# Patient Record
Sex: Female | Born: 1940 | Race: White | Hispanic: No | Marital: Married | Smoking: Never smoker
Health system: Southern US, Community
[De-identification: ages and names within clinical notes are randomized; demographics above are authoritative.]

## PROBLEM LIST (undated history)

## (undated) DIAGNOSIS — M170 Bilateral primary osteoarthritis of knee: Secondary | ICD-10-CM

## (undated) DIAGNOSIS — K219 Gastro-esophageal reflux disease without esophagitis: Secondary | ICD-10-CM

## (undated) DIAGNOSIS — E119 Type 2 diabetes mellitus without complications: Secondary | ICD-10-CM

## (undated) DIAGNOSIS — I10 Essential (primary) hypertension: Secondary | ICD-10-CM

## (undated) DIAGNOSIS — K5792 Diverticulitis of intestine, part unspecified, without perforation or abscess without bleeding: Secondary | ICD-10-CM

## (undated) DIAGNOSIS — R609 Edema, unspecified: Secondary | ICD-10-CM

## (undated) DIAGNOSIS — E785 Hyperlipidemia, unspecified: Secondary | ICD-10-CM

## (undated) HISTORY — PX: ESOPHAGEAL DILATION: SHX303

## (undated) HISTORY — DX: Type 2 diabetes mellitus without complications: E11.9

## (undated) HISTORY — DX: Essential (primary) hypertension: I10

## (undated) HISTORY — PX: OVARIAN CYST REMOVAL: SHX89

## (undated) HISTORY — DX: Hyperlipidemia, unspecified: E78.5

## (undated) HISTORY — DX: Gastro-esophageal reflux disease without esophagitis: K21.9

## (undated) HISTORY — DX: Diverticulitis of intestine, part unspecified, without perforation or abscess without bleeding: K57.92

## (undated) HISTORY — DX: Edema, unspecified: R60.9

---

## 1999-11-14 ENCOUNTER — Encounter: Payer: Self-pay | Admitting: Family Medicine

## 1999-11-14 ENCOUNTER — Encounter: Admission: RE | Admit: 1999-11-14 | Discharge: 1999-11-14 | Payer: Self-pay | Admitting: *Deleted

## 1999-12-04 ENCOUNTER — Encounter: Payer: Self-pay | Admitting: Family Medicine

## 1999-12-04 ENCOUNTER — Encounter: Admission: RE | Admit: 1999-12-04 | Discharge: 1999-12-04 | Payer: Self-pay | Admitting: Family Medicine

## 2000-11-17 ENCOUNTER — Encounter: Admission: RE | Admit: 2000-11-17 | Discharge: 2000-11-17 | Payer: Self-pay | Admitting: Family Medicine

## 2000-11-17 ENCOUNTER — Encounter: Payer: Self-pay | Admitting: Family Medicine

## 2001-11-18 ENCOUNTER — Encounter: Payer: Self-pay | Admitting: Family Medicine

## 2001-11-18 ENCOUNTER — Encounter: Admission: RE | Admit: 2001-11-18 | Discharge: 2001-11-18 | Payer: Self-pay | Admitting: Family Medicine

## 2002-11-23 ENCOUNTER — Encounter: Admission: RE | Admit: 2002-11-23 | Discharge: 2002-11-23 | Payer: Self-pay | Admitting: Family Medicine

## 2002-11-23 ENCOUNTER — Encounter: Payer: Self-pay | Admitting: Family Medicine

## 2003-08-05 ENCOUNTER — Ambulatory Visit (HOSPITAL_COMMUNITY): Admission: RE | Admit: 2003-08-05 | Discharge: 2003-08-05 | Payer: Self-pay | Admitting: Family Medicine

## 2003-09-05 ENCOUNTER — Encounter (HOSPITAL_COMMUNITY): Admission: RE | Admit: 2003-09-05 | Discharge: 2003-10-05 | Payer: Self-pay | Admitting: Orthopedic Surgery

## 2003-11-24 ENCOUNTER — Encounter: Admission: RE | Admit: 2003-11-24 | Discharge: 2003-11-24 | Payer: Self-pay | Admitting: Family Medicine

## 2004-04-19 ENCOUNTER — Inpatient Hospital Stay (HOSPITAL_COMMUNITY): Admission: AD | Admit: 2004-04-19 | Discharge: 2004-04-22 | Payer: Self-pay | Admitting: Family Medicine

## 2004-06-06 ENCOUNTER — Ambulatory Visit: Payer: Self-pay | Admitting: Internal Medicine

## 2004-07-03 ENCOUNTER — Ambulatory Visit (HOSPITAL_COMMUNITY): Admission: RE | Admit: 2004-07-03 | Discharge: 2004-07-03 | Payer: Self-pay | Admitting: Internal Medicine

## 2004-07-03 ENCOUNTER — Ambulatory Visit: Payer: Self-pay | Admitting: Internal Medicine

## 2004-11-30 ENCOUNTER — Encounter: Admission: RE | Admit: 2004-11-30 | Discharge: 2004-11-30 | Payer: Self-pay | Admitting: Family Medicine

## 2005-12-03 ENCOUNTER — Encounter: Admission: RE | Admit: 2005-12-03 | Discharge: 2005-12-03 | Payer: Self-pay | Admitting: Family Medicine

## 2006-06-11 ENCOUNTER — Ambulatory Visit (HOSPITAL_COMMUNITY): Admission: RE | Admit: 2006-06-11 | Discharge: 2006-06-11 | Payer: Self-pay | Admitting: Family Medicine

## 2006-08-21 ENCOUNTER — Encounter (HOSPITAL_COMMUNITY): Admission: RE | Admit: 2006-08-21 | Discharge: 2006-09-20 | Payer: Self-pay | Admitting: Family Medicine

## 2006-12-08 ENCOUNTER — Encounter: Admission: RE | Admit: 2006-12-08 | Discharge: 2006-12-08 | Payer: Self-pay | Admitting: Family Medicine

## 2007-08-22 ENCOUNTER — Ambulatory Visit: Payer: Self-pay | Admitting: Internal Medicine

## 2007-08-22 ENCOUNTER — Ambulatory Visit (HOSPITAL_COMMUNITY): Admission: EM | Admit: 2007-08-22 | Discharge: 2007-08-22 | Payer: Self-pay | Admitting: Emergency Medicine

## 2007-10-09 ENCOUNTER — Ambulatory Visit (HOSPITAL_COMMUNITY): Admission: RE | Admit: 2007-10-09 | Discharge: 2007-10-09 | Payer: Self-pay | Admitting: Internal Medicine

## 2007-10-09 ENCOUNTER — Ambulatory Visit: Payer: Self-pay | Admitting: Internal Medicine

## 2007-10-09 ENCOUNTER — Encounter: Payer: Self-pay | Admitting: Internal Medicine

## 2007-12-09 ENCOUNTER — Encounter: Admission: RE | Admit: 2007-12-09 | Discharge: 2007-12-09 | Payer: Self-pay | Admitting: Family Medicine

## 2008-12-09 ENCOUNTER — Encounter: Admission: RE | Admit: 2008-12-09 | Discharge: 2008-12-09 | Payer: Self-pay | Admitting: Family Medicine

## 2009-12-07 ENCOUNTER — Encounter: Admission: RE | Admit: 2009-12-07 | Discharge: 2009-12-07 | Payer: Self-pay | Admitting: Family Medicine

## 2010-10-30 NOTE — Op Note (Signed)
Tracy Mckee, Tracy Mckee             ACCOUNT NO.:  [id number]   MEDICAL RECORD NO.:  [id number]          PATIENT TYPE:  AMB   LOCATION:  DAY                           FACILITY:  [facility]   PHYSICIAN:  R. Roetta Sessions, M.D. DATE OF BIRTH:  [birth year]   DATE OF PROCEDURE:  10/09/2007  DATE OF DISCHARGE:                               OPERATIVE REPORT   PROCEDURE PERFORMED:  Esophagogastroduodenoscopy with Elease Hashimoto dilation  and biopsies.   INDICATIONS FOR PROCEDURE:  This 70 year old lady with esophageal  dysphagia presented to emergency room on September 11, 2007, with a food  impaction.  I emergently disimpacted her.  She was found to have a  fibrous ring.  Exam was not complete secondary to the stomach not being  empty.  She has done well, chewing her food very carefully and taking  her time to eat.  Since that time, esophageal dilation as appropriate is  being done.  This approach was discussed with the patient at length.  Potential risks, benefits, and have been reviewed.  Questions answered.  Please see documentation in the medical record.   PROCEDURE NOTE:  O2 saturation, blood pressure, pulse and respirations  were monitored throughout the entire procedure.   CONSCIOUS SEDATION:  Versed 5 mg IV and Demerol 100 g IV in divided  doses.   INSTRUMENT:  Pentax video chip system.   FINDINGS:  Examination of the tubular esophagus revealed a 3-cm tongue  of salmon-colored epithelium coming up from the distal aspect of the  esophagus circumferentially, and in the distal esophagus there was  salmon-colored epithelium coming up a couple of centimeters above the GE  junction with prominent neovascularization pattern.  I did not see any  evidence of active esophagitis or neoplasia.  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  The tubular esophagus was  mildly patent.  EG junction easily traversed.  Stomach:  Gastric cavity  was empty and insufflated well.  A thorough examination of the gastric  mucosa including retroflex at the proximal stomach,  esophagogastric  junction demonstrated a hiatal hernia.  The remainder of gastric mucosa  appeared normal.  Pylorus was patent and easily traversed.  Examination  of the bulb and second portion revealed no abnormalities.   THERAPY/ DIAGNOSTIC MANEUVERS PERFORMED:  A 58-French Maloney dilator  was passed  to full insertion with ease.  A look back revealed the ring  had been ruptured and there was a superficial tear into the UES,  otherwise, there was no apparent complication related to passage of the  large bore dilator; there was minimal bleeding.  Subsequent biopsies of  the tongue and the circumferential area of salmon-colored epithelium  coming up the distal esophagus were taken for histologic study.  Multiple biopsies were taken.  The patient tolerated the procedure well  and was reactive in Endoscopy.   .IMPRESSION:  1. 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  2. Noncritical Schatzki's ring, status post dilation described above.      Hiatal hernia, otherwise normal stomach, patent pylorus, normal      D1, D2.   RECOMMENDATIONS:  1. Omeprazole 20 mg orally daily.  2. Follow up on path.  3. Further recommendations to follow.      Jonathon Bellows, M.D.  Electronically Signed     RMR/MEDQ  D:  10/09/2007  T:  10/10/2007  Job:  [id number]   cc:   Donna Bernard, M.D.  Fax: [phone]

## 2010-10-30 NOTE — Op Note (Signed)
Tracy Mckee, Tracy Mckee             ACCOUNT NO.:  [id number]   MEDICAL RECORD NO.:  [id number]          PATIENT TYPE:  EMS   LOCATION:  ED                            FACILITY:  [facility]   PHYSICIAN:  R. Roetta Sessions, M.D. DATE OF BIRTH:  September 07, 1940   DATE OF PROCEDURE:  08/22/2007  DATE OF DISCHARGE:                               OPERATIVE REPORT   PROCEDURE:  Emergency EGD with food disimpaction. surely a hot biopsy.   INDICATIONS FOR PROCEDURE:  A 70 year old lady with chronic esophageal  dysphagia to solids.  She has very poor dentition and has been getting  food stuck in her esophagus for years.  She reports last evening she  swallowed a good hunk of hot dog and felt it lodge.  She has been unable  to clear it previously (typically, she is able to stick her finger down  her throat and vomit).  She presents to see Dr. Colon Branch today in the ED.  Dr. Colon Branch called me.  Emergency EGD is now being done for disimpaction.  This approach has been discussed with the patient at length.  Potential  risks, benefits, alternatives, and limitations have been reviewed.  She  understands we will not be able to dilate any lesions today, and she  would have come back on another occasional when her upper GI tract is  empty.   PROCEDURE NOTE:  O2 saturation, blood pressure, pulse, and respirations  were monitored throughout the entire procedure.   CONSCIOUS SEDATION:  1. Versed 2 mg IV, Demerol 50 mg IV in a single dose.  2. Cetacaine spray topical pharyngeal anesthesia.   INSTRUMENT:  Pentax video chip system.   FINDINGS:  The esophagus was easily intubated.  There was a large piece  of meat ball valving the distal esophagus.  Nestling the proximal tip of  scope up against this bolus and trying to nudge it down, it would not  go.  Subsequently, I obtained the Lucina Mellow net and went around a good bit of  the proximal aspect of this food bolus and engaged it was able to pull  out a good piece without much  difficulty.  I reintroduced scope in the  esophagus.  There was still a large piece remaining.  I was able to  engage it with the Lucina Mellow net once again and pulled out, again without too  much difficulty.  I reintroduced the scope into the esophagus a third  time and saw a thick, fibrous Schatzki's ring.  The esophagus was  macerated and inflamed.  There was a superficial tear midesophagus and  the hiatal hernia. I made no attempt to complete the exam, and no  attempts dilation were performed.  The patient tolerated the relatively  brief procedure very well and was reactivated.   ENDOSCOPIC IMPRESSION:  Large meat bolus impacted in the esophagus,  status post piecemeal removal, as described above.  Macerated esophageal  mucosa, likely from trauma food impaction over the past 24 hours.  Thick, fibrous distal ring, and a hiatal hernia.  Exam otherwise  incomplete.   RECOMMENDATIONS:  1. Avoid meat, bread, and  other foodstuffs that would predispose one      to recurrent food impaction.  Would stay on a soft diet and chew      food as adequately as possible.  2. Ms. Panjwani needs to see the dentist about getting some teeth.  3. Zegerid 40 mg suspension 1 dose daily for the next 3 weeks.  In 3      weeks, we will bring this nice lady back in for an elective EGD and      esophageal dilation as appropriate.      Jonathon Bellows, M.D.  Electronically Signed     RMR/MEDQ  D:  08/22/2007  T:  08/24/2007  Job:  [id number]   cc:   Donna Bernard, M.D.  Fax: [phone]   Nicoletta Dress. Colon Branch, M.D.  Fax: [phone]

## 2010-11-02 NOTE — Consult Note (Signed)
NAMEMELINA, MOSTELLER             ACCOUNT NO.:  [id number]   MEDICAL RECORD NO.:  [id number]          PATIENT TYPE:  AMB   LOCATION:  DAY                           FACILITY:  [facility]   PHYSICIAN:  Lionel December, M.D.    DATE OF BIRTH:  1940/10/25   DATE OF CONSULTATION:  DATE OF DISCHARGE:  04/22/2004                                   CONSULTATION   REFERRING PHYSICIAN:  Scott A. Gerda Diss, M.D.   REASON FOR CONSULTATION:  Diverticulitis followup.   HISTORY OF PRESENT ILLNESS:  Ms. Inzunza is 70 year old Caucasian female who  was admitted to [facility] with an acute bout of sigmoid  diverticulitis on April 19, 2004.  She had been having bilateral lower  quadrant cramping abdominal pain as well as fever.  She was treated with IV  Levaquin and Flagyl while hospitalized and then discharged home on  clindamycin 300 mg q.i.d. for 10 days.  She has since completed her course  of antibiotic and is doing quite well.  She does report occasional  intermittent fleeting cramping to her lower abdomen, denies any fever or  chills, nausea, vomiting, diarrhea, or constipation as this time.  Bowel  movements have been soft and brown without any rectal bleeding or melena or  mucus in her stools.  Weight has remained stable, appetite is good.  She has  never had a complete colonoscopy.  She did have a flexible sigmoidoscopy in  1999, by Dr. Karilyn Cota which was normal.   PAST MEDICAL HISTORY:  1.  Diabetes mellitus which is diet controlled at this time.  2.  Hypertension diagnosed about 1-1/2 years ago.  3.  Flexible sigmoidoscopy in 1999, by Dr. Karilyn Cota, which was normal.  4.  Right laparotomy for right ovarian cystectomy.   CURRENT MEDICATIONS:  Unknown antihypertensives, the patient will call with  this medication.   ALLERGIES:  No known drug allergies.   FAMILY HISTORY:  No known family history of colorectal carcinoma, liver or  chronic GI problems.  Mother deceased at age 101 with breast  carcinoma.  Father deceased secondary to accident.  She has one brother alive and  healthy.  One sister deceased.   SOCIAL HISTORY:  Ms. Hausler has been married for 37 years.  She has three  grown healthy children.  She is employed full-time with [employer] and  also works part-time at [employer].  She reports a remote seven year tobacco use  history.  Reports consuming approximately one alcoholic beverage per day,  typically a beer or a glass of wine.  Denies any drug use.   REVIEW OF SYSTEMS:  CONSTITUTIONAL:  Denies any fatigue, denies anorexia or  early satiety.  Weight is stable.  CARDIOVASCULAR:  Denies any chest pain,  or history of heart murmur, valve problems.  PULMONARY:  Denies any  shortness of breath, dyspnea, cough, or hemoptysis.  GASTROINTESTINAL:  See  HPI.  Denies any heartburn, indigestion, dysphagia, or odynophagia.   PHYSICAL EXAMINATION:  VITAL SIGNS:  Weight 221.5 pounds, temperature 97.8,  blood pressure 150/84, pulse 68.  GENERAL:  Ms. Kole is a  70 year old obese Caucasian female who is alert,  oriented, pleasant, and cooperative, in no acute distress.  HEENT:  Sclerae clear, nonicteric.  Conjunctivae pink.  Oropharynx moist  without any lesions.  NECK:  Supple without mass or thyromegaly.  CHEST:  Heart is regular rate and rhythm.  Normal S1 and S2, without  murmurs, rubs, or gallops.  LUNGS:  Clear to auscultation bilaterally.  ABDOMEN:  Protuberant, obese, with positive bowel sounds x4.  No bruits  auscultated.  Soft, nontender, nondistended.  She does have a low midline  scar which is well-healed.  Abdomen is nontender without palpable mass or  hepatosplenomegaly.  No rebound tenderness or guarding.  Although  examination is limited due to patient's body habitus.  RECTAL:  Deferred.  EXTREMITIES:  Trace edema bilateral lower extremities with 2+ pedal pulses  bilaterally.  SKIN:  Pink, warm, and dry, without any rash or jaundice.   IMPRESSION:  Ms.  Swalley is a 70 year old Caucasian female with a bout of  acute sigmoid diverticulitis, approximately six weeks ago she has responded  well to antibiotic therapy and it appears her diverticulitis has resolved.  Given the fact that she has never had complete colonoscopy, we should  proceed with this at this time to make sure that there is no underlying  colorectal carcinoma.  She is in agreement with this plan.   RECOMMENDATIONS:  1.  We will schedule colonoscopy with Dr. Karilyn Cota in the next two weeks or      so.  If she develops any further pain or new signs or symptoms, she is      to give our office a call.  2.  I have discussed colonoscopy including risks and benefits to include,      but not limited to bleeding, perforation, drug reaction.  Informed      consent will be obtained.  3.  Further recommendations pending procedure with standard diverticulosis      instructions given.   We would like to thank Dr. Simone Curia for allowing [place] to participate in  the care of Ms. Kressin.     Kand   KC/MEDQ  D:  06/06/2004  T:  06/06/2004  Job:  [id number]   cc:   Donna Bernard, M.D.  [address]  [place]  [place] [address]  Fax: [phone]

## 2010-11-02 NOTE — Op Note (Signed)
NAMEDAURICE, OVANDO             ACCOUNT NO.:  [id number]   MEDICAL RECORD NO.:  [id number]          PATIENT TYPE:  AMB   LOCATION:  DAY                           FACILITY:  [facility]   PHYSICIAN:  Lionel December, M.D.    DATE OF BIRTH:  [birth year]   DATE OF PROCEDURE:  07/03/2004  DATE OF DISCHARGE:                                 OPERATIVE REPORT   PROCEDURE:  Total colonoscopy.   INDICATIONS:  Tracy Mckee is a 70 year old Caucasian female who was hospitalized  in November 2005 due to diverticulitis. She was advised colonoscopy once she  recovers from acute illness. She is doing well other than transient episodes  of lower abdominal pain. She is supposed to be on high-fiber diet and  Citrucel.  She states she does not the Citrucel daily. She is undergoing  colonoscopy both for diagnostic and screening purposes. Procedures risks  were reviewed with the patient, informed consent was obtained.   PREMEDICATION:  Demerol 50 milligrams IV, Versed 6 milligrams IV.   FINDINGS:  Procedure performed in endoscopy suite. The patient's vital signs  and O2 sat were monitored during procedure and remained stable. The patient  was placed left lateral recumbent position and rectal examination performed.  No abnormality noted external or digital exam. Olympus videoscope was placed  in the rectum and advanced under vision into sigmoid colon which was  somewhat noncompliant with multiple diverticula. Two diverticula at sigmoid  colon showed changes of diverticulitis, one had erythematous mucosa with  mucopurulent exudate.  The other one had peristomal erythema. Multiple  diverticula noted at descending colon and one had ulceration with  mucopurulent discharge. Scattered diverticula were also noted of transverse  colon and few at the ascending colon. Scope was passed to the cecum which  was identified by appendiceal orifice and ileocecal valve. Pictures taken  for the record. As the scope was withdrawn,  colonic mucosa was carefully  examined and there were no polyps and or tumor masses. Rectal mucosa was  normal. Scope was retroflexed to examine anorectal junction which was  unremarkable. Endoscope was straightened and withdrawn. The patient  tolerated the procedure well.   FINAL DIAGNOSIS:  1.  Pan colonic diverticulosis.  2.  Diverticulitis involving three diverticula.  Two were at sigmoid and one      at descending colon.   RECOMMENDATIONS:  Cipro 500 milligrams p.o. b.i.d. for [redacted] weeks along with  metronidazole 500 milligrams p.o. b.i.d. for 2 weeks. She needs to be on  high fiber diet and Citrucel one tablespoonful daily.   She should continue yearly Hemoccults and consider next screening in 10  years from now.      NR/MEDQ  D:  07/03/2004  T:  07/03/2004  Job:  [id number]   cc:   Lorin Picket A. Gerda Diss, MD  [address]  [place]  [place] [address]  Fax: [phone]

## 2010-11-02 NOTE — Discharge Summary (Signed)
NAMESEINI, LANNOM             ACCOUNT NO.:  [id number]   MEDICAL RECORD NO.:  [id number]          PATIENT TYPE:  INP   LOCATION:  A335                          FACILITY:  [facility]   PHYSICIAN:  Donna Bernard, M.D.DATE OF BIRTH:  [birth year]   DATE OF ADMISSION:  04/19/2004  DATE OF DISCHARGE:  11/06/2005LH                                 DISCHARGE SUMMARY   FINAL DIAGNOSES:  1.  Diverticulitis.  2.  Hypertension.  3.  Type 2 diabetes.   FINAL DISPOSITION:  The patient is discharged to home.   DISCHARGE MEDICATIONS:  Clindamycin 300 mg q.i.d. x10 days.   FOLLOWUP:  Follow up in the office in five days.   HISTORY OF PRESENT ILLNESS:  Please see H&P as dictated.   HOSPITAL COURSE:  This patient is a 70 year old white female who presented  to the office on the day of admission with complaints of abdominal pain that  was quite severe in nature.  She was admitted to the hospital where a CBC  showed a white blood cell count of 12,000.  CT scan revealed sigmoid  diverticulitis.  The patient was placed on bowel rest and IV antibiotics.  She was given Flagyl 500 mg IV q.6h., and Levaquin 500 mg IV q.24h.  Over  the next several days, the patient gradually improved, her glucose's were  monitored and these remained within decent limits.  On the day of discharge,  the patient was feeling better, abdominal examination was improved.  She was  discharged home with diagnoses and disposition as noted above.     Kristine Royal   WSL/MEDQ  D:  05/02/2004  T:  05/02/2004  Job:  [id number]

## 2010-11-02 NOTE — H&P (Signed)
Tracy Mckee, DEJOY             ACCOUNT NO.:  [id number]   MEDICAL RECORD NO.:  [id number]          PATIENT TYPE:  INP   LOCATION:  A335                          FACILITY:  [facility]   PHYSICIAN:  Donna Bernard, M.D.DATE OF BIRTH:  08-Feb-1941   DATE OF ADMISSION:  04/19/2004  DATE OF DISCHARGE:  LH                                HISTORY & PHYSICAL   CHIEF COMPLAINT:  Abdominal pain.   HISTORY OF PRESENT ILLNESS:  This patient is a 70 year old white female with  a history of hypertension and type 2 diabetes, controlled primarily by diet,  who presented to the office on the day of admission with complaints of low  abdominal pain.  The patient has a longstanding history of occasional  transient left lower-quadrant pain which usually resolves within 24 hours.  Sometimes this is accompanied by loose stools.  However, this time the pain  came on and stayed.  The patient started to develop fever, chills and fever,  along was nausea.  She had no active vomiting.  She did have a couple of  loose stools.  She also noted some urinary frequency with no true dysuria.  The patient noted fever at home.  The patient also notes diminished appetite  this morning.  She had a bit of a difficult night with abdominal discomfort  intermittently.   PRIOR SURGERIES:  Remote cyst removal in the right ovary in 1979.   FAMILY HISTORY:  Positive for breast cancer, hypertension, heart disease.   ALLERGIES:  None known.   SOCIAL HISTORY:  The patient is married with three children.  No tobacco  use.  Occasional beer drinking three to four beers per week.  The patient  claims compliance with her medications which include Dyazide 37.5/25, one  daily.  Protonix p.r.n. and various over-the-counter medications.   REVIEW OF SYSTEMS:  Otherwise negative.   PHYSICAL EXAMINATION:  VITAL SIGNS:  Blood pressure 146/90, temperature  100.2.  GENERAL:  The patient is in apparent distress, holding abdomen.  HEENT:   Normal.  Oropharynx __________.  NECK:  Supple.  LUNGS:  Clear.  HEART:  Regular rate and rhythm.  ABDOMEN:  Low abdominal and left lower-quadrant tenderness.  PELVIC EXAM:  Adnexal tenderness on the left side.  No obvious masses.  RECTAL:  Unremarkable.  Negative.  EXTREMITIES:  Normal.  SKIN:  Normal.   LABORATORY DATA:  CBC:  White blood count 12,000 with a left shift.  MET-7  within normal limits except for glucose of 139, sodium 131.  CT scan of the  abdomen and pelvis, sigmoid diverticulosis and diverticulitis.  No abscess  or free air.   IMPRESSION:  1.  Diverticulitis.  2.  Hypertension.  3.  Type 2 diabetes, diet controlled.   PLAN:  IV antibiotics, bowel rest, IV fluids, further orders as on the  chart.      WSL/MEDQ  D:  04/20/2004  T:  04/20/2004  Job:  [id number]

## 2010-11-22 ENCOUNTER — Other Ambulatory Visit: Payer: Self-pay | Admitting: Family Medicine

## 2010-11-22 DIAGNOSIS — Z1231 Encounter for screening mammogram for malignant neoplasm of breast: Secondary | ICD-10-CM

## 2010-12-10 ENCOUNTER — Ambulatory Visit
Admission: RE | Admit: 2010-12-10 | Discharge: 2010-12-10 | Disposition: A | Discharge status: Home / Self Care | Payer: Private Health Insurance - Indemnity | Source: Ambulatory Visit | Attending: Family Medicine | Admitting: Family Medicine

## 2010-12-10 DIAGNOSIS — Z1231 Encounter for screening mammogram for malignant neoplasm of breast: Secondary | ICD-10-CM

## 2011-01-24 ENCOUNTER — Other Ambulatory Visit: Payer: Self-pay | Admitting: Family Medicine

## 2011-01-24 ENCOUNTER — Other Ambulatory Visit (HOSPITAL_COMMUNITY)
Admission: RE | Admit: 2011-01-24 | Discharge: 2011-01-24 | Disposition: A | Discharge status: Home / Self Care | Payer: Private Health Insurance - Indemnity | Source: Ambulatory Visit | Attending: Family Medicine | Admitting: Family Medicine

## 2011-01-24 DIAGNOSIS — Z124 Encounter for screening for malignant neoplasm of cervix: Secondary | ICD-10-CM | POA: Insufficient documentation

## 2011-01-24 DIAGNOSIS — Z1159 Encounter for screening for other viral diseases: Secondary | ICD-10-CM | POA: Insufficient documentation

## 2011-11-22 ENCOUNTER — Other Ambulatory Visit: Payer: Self-pay | Admitting: Family Medicine

## 2011-11-22 DIAGNOSIS — Z1231 Encounter for screening mammogram for malignant neoplasm of breast: Secondary | ICD-10-CM

## 2011-12-10 ENCOUNTER — Ambulatory Visit
Admission: RE | Admit: 2011-12-10 | Discharge: 2011-12-10 | Disposition: A | Discharge status: Home / Self Care | Payer: Medicare Other | Source: Ambulatory Visit | Attending: Family Medicine | Admitting: Family Medicine

## 2011-12-10 DIAGNOSIS — Z1231 Encounter for screening mammogram for malignant neoplasm of breast: Secondary | ICD-10-CM

## 2011-12-13 ENCOUNTER — Other Ambulatory Visit: Payer: Self-pay | Admitting: Family Medicine

## 2011-12-13 DIAGNOSIS — R928 Other abnormal and inconclusive findings on diagnostic imaging of breast: Secondary | ICD-10-CM

## 2011-12-25 ENCOUNTER — Ambulatory Visit
Admission: RE | Admit: 2011-12-25 | Discharge: 2011-12-25 | Disposition: A | Discharge status: Home / Self Care | Payer: Medicare Other | Source: Ambulatory Visit | Attending: Family Medicine | Admitting: Family Medicine

## 2011-12-25 DIAGNOSIS — R928 Other abnormal and inconclusive findings on diagnostic imaging of breast: Secondary | ICD-10-CM

## 2012-11-25 ENCOUNTER — Other Ambulatory Visit: Payer: Self-pay

## 2012-11-25 DIAGNOSIS — Z1231 Encounter for screening mammogram for malignant neoplasm of breast: Secondary | ICD-10-CM

## 2013-01-05 ENCOUNTER — Ambulatory Visit
Admission: RE | Admit: 2013-01-05 | Discharge: 2013-01-05 | Disposition: A | Discharge status: Home / Self Care | Payer: Medicare Other | Source: Ambulatory Visit

## 2013-01-05 DIAGNOSIS — Z1231 Encounter for screening mammogram for malignant neoplasm of breast: Secondary | ICD-10-CM

## 2015-11-16 ENCOUNTER — Encounter (HOSPITAL_COMMUNITY): Payer: Self-pay | Admitting: Emergency Medicine

## 2015-11-16 ENCOUNTER — Emergency Department (HOSPITAL_COMMUNITY)
Admission: EM | Admit: 2015-11-16 | Discharge: 2015-11-16 | Disposition: A | Discharge status: Home / Self Care | Payer: PPO | Attending: Emergency Medicine | Admitting: Emergency Medicine

## 2015-11-16 ENCOUNTER — Emergency Department (HOSPITAL_COMMUNITY): Payer: PPO

## 2015-11-16 DIAGNOSIS — Z79899 Other long term (current) drug therapy: Secondary | ICD-10-CM | POA: Diagnosis not present

## 2015-11-16 DIAGNOSIS — I10 Essential (primary) hypertension: Secondary | ICD-10-CM | POA: Diagnosis not present

## 2015-11-16 DIAGNOSIS — E785 Hyperlipidemia, unspecified: Secondary | ICD-10-CM | POA: Diagnosis not present

## 2015-11-16 DIAGNOSIS — G8929 Other chronic pain: Secondary | ICD-10-CM | POA: Diagnosis not present

## 2015-11-16 DIAGNOSIS — E119 Type 2 diabetes mellitus without complications: Secondary | ICD-10-CM | POA: Insufficient documentation

## 2015-11-16 DIAGNOSIS — Z7984 Long term (current) use of oral hypoglycemic drugs: Secondary | ICD-10-CM | POA: Diagnosis not present

## 2015-11-16 DIAGNOSIS — M25561 Pain in right knee: Secondary | ICD-10-CM | POA: Diagnosis present

## 2015-11-16 MED ORDER — HYDROCODONE-ACETAMINOPHEN 5-325 MG PO TABS
1.0000 | ORAL_TABLET | Freq: Once | ORAL | Status: AC
  Administered 2015-11-16: 1 via ORAL
  Filled 2015-11-16: qty 1

## 2015-11-16 MED ORDER — HYDROCODONE-ACETAMINOPHEN 5-325 MG PO TABS: ORAL_TABLET | ORAL | Status: DC

## 2015-11-16 NOTE — Discharge Instructions (Signed)
Take the prescription as directed.  Apply moist heat or ice to the area(s) of discomfort, for 15 minutes at a time, several times per day for the next few days.  Do not fall asleep on a heating or ice pack. Wear the knee immobilizer and use the crutches until you are seen in follow up. Call the Orthopedic doctor tomorrow to schedule a follow up appointment within the next week.  Return to the Emergency Department immediately if worsening.
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
## 2015-11-16 NOTE — ED Notes (Signed)
PT states she was putting boxes in her car last night and twisted her right knee. PT c/o pain unrelieved by Aleve medication. PT ambulatory in triage with knee brace in place a this time.

## 2015-11-16 NOTE — ED Notes (Signed)
MD at bedside. 

## 2015-11-22 ENCOUNTER — Encounter: Payer: Self-pay | Admitting: Orthopaedic Surgery

## 2015-11-22 ENCOUNTER — Ambulatory Visit (INDEPENDENT_AMBULATORY_CARE_PROVIDER_SITE_OTHER): Payer: PPO | Admitting: Orthopaedic Surgery

## 2015-11-22 VITALS — BP 144/74 | HR 83 | Temp 97.5°F | Ht 62.25 in | Wt 190.0 lb

## 2015-11-22 DIAGNOSIS — M25561 Pain in right knee: Secondary | ICD-10-CM

## 2015-11-22 MED ORDER — TRAMADOL HCL 50 MG PO TABS: 50.0000 mg | ORAL_TABLET | Freq: Four times a day (QID) | ORAL | Status: AC | PRN
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
## 2015-11-22 NOTE — Patient Instructions (Addendum)
Patient to get her records sent to us from her Ortho Dr. In [place].  Begin PT.  Take pain medicine as directed.

## 2015-12-05 ENCOUNTER — Ambulatory Visit (INDEPENDENT_AMBULATORY_CARE_PROVIDER_SITE_OTHER): Payer: PPO | Admitting: Orthopaedic Surgery

## 2015-12-05 ENCOUNTER — Encounter: Payer: Self-pay | Admitting: Orthopaedic Surgery

## 2015-12-05 ENCOUNTER — Encounter (HOSPITAL_COMMUNITY): Payer: Self-pay | Admitting: Physical Therapy

## 2015-12-05 ENCOUNTER — Ambulatory Visit (HOSPITAL_COMMUNITY): Payer: PPO | Attending: Orthopaedic Surgery | Admitting: Physical Therapy

## 2015-12-05 VITALS — BP 153/71 | HR 77 | Temp 97.9°F | Ht 63.0 in | Wt 189.0 lb

## 2015-12-05 DIAGNOSIS — S83206D Unspecified tear of unspecified meniscus, current injury, right knee, subsequent encounter: Secondary | ICD-10-CM | POA: Diagnosis not present

## 2015-12-05 DIAGNOSIS — M6281 Muscle weakness (generalized): Secondary | ICD-10-CM | POA: Insufficient documentation

## 2015-12-05 DIAGNOSIS — M179 Osteoarthritis of knee, unspecified: Secondary | ICD-10-CM

## 2015-12-05 DIAGNOSIS — R2681 Unsteadiness on feet: Secondary | ICD-10-CM | POA: Diagnosis present

## 2015-12-05 DIAGNOSIS — R2689 Other abnormalities of gait and mobility: Secondary | ICD-10-CM | POA: Insufficient documentation

## 2015-12-05 DIAGNOSIS — M25561 Pain in right knee: Secondary | ICD-10-CM | POA: Diagnosis present

## 2015-12-05 DIAGNOSIS — M1711 Unilateral primary osteoarthritis, right knee: Secondary | ICD-10-CM

## 2015-12-05 DIAGNOSIS — M25661 Stiffness of right knee, not elsewhere classified: Secondary | ICD-10-CM

## 2015-12-05 NOTE — Patient Instructions (Signed)
Last MRI was done in [place] in Oct. 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 Precautions discussed.  Continue therapy. Form for a parking handicap sticker was filled out.  Out of work.

## 2015-12-05 NOTE — Therapy (Signed)
[facility] [address] [place], [place], [address] Phone: [phone]   Fax:  [phone]  Physical Therapy Evaluation  Patient Details  Name: Tracy Mckee MRN: [id number] Date of Birth: 1941/03/12 Referring Provider: Darreld Mclean, MD  Encounter Date: 12/05/2015      PT End of Session - 12/05/15 2110    Visit Number 1   Number of Visits 6   Date for PT Re-Evaluation 12/26/15   Authorization Type [employer]   Authorization Time Period 12/05/15 to 01/19/16   PT Start Time 1518   PT Stop Time 1600   PT Time Calculation (min) 42 min   Activity Tolerance Patient tolerated treatment well   Behavior During Therapy [facility] for tasks assessed/performed      Past Medical History  Diagnosis Date  . Diverticulitis   . Diabetes mellitus without complication (HCC)   . Hyperlipidemia   . Hypertension   . Edema   . GERD (gastroesophageal reflux disease)   . Knee pain     Past Surgical History  Procedure Laterality Date  . Ovarian cyst removal    . Esophageal dilation      There were no vitals filed for this visit.       Subjective Assessment - 12/05/15 1518    Subjective Pt reports recently relocating from [place]. She went to a specialist in [place] back in November 2016 after pain that came out of nowhere. They did imaging and found a ligament that was "slightly" torn. She saw a PT there and received ultrasound which she feels helped the most. 2 weeks ago she twisted her knee and went to Dr. Hilda Lias who sent her to PT to see what improvement she can get from her session the next week.   Currently in Pain? Yes   Pain Score --  aching   Pain Location Knee   Pain Orientation Right;Medial   Pain Descriptors / Indicators Aching   Pain Type Acute pain   Pain Radiating Towards none    Pain Onset 1 to 4 weeks ago   Pain Frequency Intermittent   Aggravating Factors  standing/walking    Pain Relieving Factors resting   Effect  of Pain on Daily Activities unable to do ADLs.             [facility] PT Assessment - 12/06/15 0001    Assessment   Medical Diagnosis Rt knee pain   Referring Provider Darreld Mclean, MD   Onset Date/Surgical Date 11/21/15   Next MD Visit 12/12/15   Precautions   Precautions None   Precaution Comments pt wearing knee immobilizer   Restrictions   Weight Bearing Restrictions No   Balance Screen   Has the patient fallen in the past 6 months No   Has the patient had a decrease in activity level because of a fear of falling?  No   Is the patient reluctant to leave their home because of a fear of falling?  No   Observation/Other Assessments   Focus on Therapeutic Outcomes (FOTO)  63% limited    Functional Tests   Functional tests Sit to Stand   Sit to Stand   Comments increased weight shift to the Lt    Strength   Right Hip Extension 4-/5   Right Hip ABduction 4/5   Left Hip Flexion 5/5   Left Hip Extension 4-/5   Left Hip ABduction 4/5   Right Knee Flexion 4/5   Right Knee Extension 4/5  Left Knee Flexion 5/5   Left Knee Extension 5/5   Right Ankle Dorsiflexion 5/5   Left Ankle Dorsiflexion 5/5   Palpation   Palpation comment Rt knee medial jt line tenderness; distal medial Rt hamstring/hip adductors tender to palpation   Special Tests    Special Tests Knee Special Tests;Meniscus Tests   Meniscus Tests McMurray Test;Apley's Compression;other;other2   McMurray Test   Findings Positive   Side Right   Comments medial jt line pain   Apley's Compression   Findings Negative   Side  Right   other   Findings Positive   Side Right   Comments Valgus stress test for MCL   Transfers   Five time sit to stand comments  14.48 sec   Ambulation/Gait   Ambulation/Gait Yes   Ambulation Distance (Feet) 200 Feet   Gait Comments initially without AD noting evident antalgic gait pattern; noting impovement in posture/pattern with use of SPC; overall: decreased stance time Rt, decreased step  length Lt, knee flexion throughout cycle   Balance   Balance Assessed Yes  SLS BLE up to 20 sec                           PT Education - 12/05/15 2106    Education provided Yes   Education Details eval findings/POC; anatomy of knee and purpose of meniscus; discussed importance of good hip/knee strength to support knee joint regardless of whether she undergoes repair or not; initiated HEP   Person(s) Educated Patient   Methods Explanation;Demonstration;Handout   Comprehension Verbalized understanding          PT Short Term Goals - 12/06/15 0804    PT SHORT TERM GOAL #1   Title Pt will demo consistency and independence with HEP   Time 2   Period Weeks   Status New   PT SHORT TERM GOAL #2   Title Pt will demo improve knee extension ROM to atleast 0 deg in order to improve safety with ambulation   Time 3   Period Weeks   Status New   PT SHORT TERM GOAL #3   Title Pt will demo improved gait pattern and use of SPC x150' with improved step length and stance time on RLE   Time 3   Period Weeks   Status New   PT SHORT TERM GOAL #4   Title Pt will demo correct sit to stand technique without significant weight shift Lt throughout her session with no cues from PT   Time 3   Period Weeks   Status New           PT Long Term Goals - 12/06/15 0806    PT LONG TERM GOAL #1   Title Pt will demo improved BLE strength to 5/5 in order to improve safety with functional mobility.   Time 6   Period Weeks   Status New   PT LONG TERM GOAL #2   Title Pt will demo improved functional strength evident by her ability to perform 5x sit to stand in less than 13 sec without UE support.    Time 6   Period Weeks   Status New               Plan - 12/05/15 2114    Clinical Impression Statement Pt was referred to OPPT concerning Rt knee pain with meniscal tear of several months. Pain was initially alleviated after several weeks of conservative management. 2  weeks ago  she twisted her knee and since then she has had intense pain along the medial aspect of her Rt knee along with catching and occasional buckling. She has not had any new imaging, however past imaging showed medial meniscus tear and McMurray's test is positive along medial jt. Knee valgus testing was also positive, and she is tender to palpation along the medial thigh, so I am unable to rule out possible MCL involvement as well. Pain has gotten worse since the onset and she has been using an immobilizer for ~2 weeks. She demonstrates limitations in Rt knee ROM, LE strength, and impaired mobility with noted antalgic gait pattern walking into the clinic. I educated the pt on the anatomy of the knee as well as the role of the meniscus. I also provided HEP and encouraged her to perform daily to see if any change occurs. Overall, her PT prognosis is fair due to the structural damage and worsening nature of her knee even after immobilization. She would benefit from skilled PT to address muscle spasm/guarding noted throughout her hamstrings/adductors as well as improve her knee ROM and BLE strength. This will increase the stability at the knee and decrease risk of falls/further injury. Pt is in agreement with proposed PT frequency and POC.    Rehab Potential Fair   Clinical Impairments Affecting Rehab Potential structural damage to mensicus and worsening pain since initial onset    PT Frequency 1x / week   PT Duration 6 weeks   PT Treatment/Interventions ADLs/Self Care Home Management;Cryotherapy;Gait training;Stair training;Functional mobility training;Therapeutic activities;Patient/family education;Neuromuscular re-education;Balance training;Therapeutic exercise;Orthotic Fit/Training;Manual techniques;Passive range of motion;Taping   PT Next Visit Plan f/u concerning MD apt; hip strengthening (bridges, hip abduction), manual to hamstrings/adductors to address muscle spasming/decrease pain    PT Home Exercise Plan  initiated with quad set, bridge, clamshell, hip adductor stretch   Recommended Other Services None    Consulted and Agree with Plan of Care Patient      Patient will benefit from skilled therapeutic intervention in order to improve the following deficits and impairments:  Abnormal gait, Decreased activity tolerance, Decreased balance, Decreased endurance, Decreased mobility, Decreased range of motion, Impaired flexibility, Pain, Improper body mechanics, Difficulty walking, Increased muscle spasms  Visit Diagnosis: Pain in right knee  Stiffness of right knee, not elsewhere classified  Unsteadiness on feet  Other abnormalities of gait and mobility  Muscle weakness (generalized)      G-Codes - 12-16-2015 10-27-2107    Functional Assessment Tool Used FOTO; atleast 60% limited   Functional Limitation Mobility: Walking and moving around   Mobility: Walking and Moving Around Current Status [phone]) At least 40 percent but less than 60 percent impaired, limited or restricted   Mobility: Walking and Moving Around Goal Status [id number]) At least 40 percent but less than 60 percent impaired, limited or restricted       Problem List There are no active problems to display for this patient.   8:09 AM,12/06/2015 Marylyn Ishihara PT, DPT [facility] [phone]  [facility] [address] [place], [place], [address] Phone: [phone]   Fax:  [phone]  Name: Tracy Mckee MRN: [id number] Date of Birth: March 19, 1941

## 2015-12-05 NOTE — Progress Notes (Signed)
Patient WJ:XBJYNWG:Tracy Mckee, female DOB:1940/11/18, 75 y.o. NFA:[id number]  Chief Complaint  Patient presents with  . Follow-up    Right knee    HPI  Tracy Mckee is a 75 y.o. female who has continued right knee pain.  She had notes sent from [facility] in [place].  I just received them.  She has a medial meniscus tear of the right knee documented by MRI in October, 2016 in [place].  She went to PT and got better until recently.  I have explained what this is and she has not been to PT yet here.  She has an appointment today later.  I have told her she may need arthroscopy as she is not improving. She has swelling, popping, giving way and deep pain at times.  She has no new injury.  HPI  Body mass index is 33.49 kg/(m^2).  ROS  Review of Systems  HENT: Negative for congestion.   Respiratory: Negative for cough and shortness of breath.   Cardiovascular: Negative for chest pain and leg swelling.  Endocrine: Positive for cold intolerance.  Musculoskeletal: Positive for joint swelling, arthralgias and gait problem.  Allergic/Immunologic: Positive for environmental allergies.  Neurological: Negative for numbness.    Past Medical History  Diagnosis Date  . Diverticulitis   . Diabetes mellitus without complication (HCC)   . Hyperlipidemia   . Hypertension   . Edema   . GERD (gastroesophageal reflux disease)   . Knee pain     Past Surgical History  Procedure Laterality Date  . Ovarian cyst removal    . Esophageal dilation      History reviewed. No pertinent family history.  Social History Social History  Substance Use Topics  . Smoking status: Never Smoker   . Smokeless tobacco: None  . Alcohol Use: Yes     Comment: occassionally    Allergies  Allergen Reactions  . Baclofen   . Ciprofloxacin   . Hydrocodone-Acetaminophen Other (See Comments)    Patient states it made her feel like she was having a heart attack  . Other Swelling    Chest tightness-2  unknown antibiotics (One starts with a "B" and the other, a "C")    Current Outpatient Prescriptions  Medication Sig Dispense Refill  . fluticasone (FLONASE) 50 MCG/ACT nasal spray Place into both nostrils daily.    Marland Kitchen. glimepiride (AMARYL) 2 MG tablet Take 2 mg by mouth daily with breakfast.    . metFORMIN (GLUCOPHAGE-XR) 500 MG 24 hr tablet Take 500 mg by mouth 2 (two) times daily.    Marland Kitchen. omeprazole (PRILOSEC) 40 MG capsule Take 40 mg by mouth daily.    . traMADol (ULTRAM) 50 MG tablet Take 1 tablet (50 mg total) by mouth every 6 (six) hours as needed. 60 tablet 3  . triamterene-hydrochlorothiazide (MAXZIDE-25) 37.5-25 MG per tablet Take 1 tablet by mouth daily.     No current facility-administered medications for this visit.     Physical Exam  Blood pressure 153/71, pulse 77, temperature 97.9 F (36.6 C), height 5\' 3"  (1.6 m), weight 189 lb (85.73 kg).  Constitutional: overall normal hygiene, normal nutrition, well developed, normal grooming, normal body habitus. Assistive device:Knee immobilizer  Musculoskeletal: gait and station Limp right, muscle tone and strength are normal, no tremors or atrophy is present.  .  Neurological: coordination overall normal.  Deep tendon reflex/nerve stretch intact.  Sensation normal.  Cranial nerves II-XII intact.   Skin:   normal overall no scars, lesions, ulcers or rashes. No  psoriasis.  Psychiatric: Alert and oriented x 3.  Recent memory intact, remote memory unclear.  Normal mood and affect. Well groomed.  Good eye contact.  Cardiovascular: overall no swelling, no varicosities, no edema bilaterally, normal temperatures of the legs and arms, no clubbing, cyanosis and good capillary refill.  Lymphatic: palpation is normal.  The right lower extremity is examined:  Inspection:  Thigh:  Non-tender and no defects  Knee has swelling 2+ effusion.                        Joint tenderness is present                        Patient is tender over the  medial joint line  Lower Leg:  Has normal appearance and no tenderness or defects  Ankle:  Non-tender and no defects  Foot:  Non-tender and no defects Range of Motion:  Knee:  Range of motion is: 0-100                        Crepitus is  present  Ankle:  Range of motion is normal. Strength and Tone:  The right lower extremity has normal strength and tone. Stability:  Knee:  The knee has positive medial McMurray.  Ankle:  The ankle is stable.    The patient has been educated about the nature of the problem(s) and counseled on treatment options.  The patient appeared to understand what I have discussed and is in agreement with it.  Encounter Diagnoses  Name Primary?  . Meniscus tear, right, subsequent encounter Yes  . Osteoarthritis of right knee, unspecified osteoarthritis type     PLAN Call if any problems.  Precautions discussed.  Continue current medications.   Return to clinic 1 week   Go to PT  Consider arthroscopy.  Electronically Signed Darreld Mclean, MD 6/20/20172:20 PM

## 2015-12-12 ENCOUNTER — Ambulatory Visit (INDEPENDENT_AMBULATORY_CARE_PROVIDER_SITE_OTHER): Payer: PPO | Admitting: Orthopaedic Surgery

## 2015-12-12 ENCOUNTER — Telehealth (HOSPITAL_COMMUNITY): Payer: Self-pay | Admitting: Physical Therapy

## 2015-12-12 ENCOUNTER — Encounter: Payer: Self-pay | Admitting: Orthopaedic Surgery

## 2015-12-12 VITALS — BP 145/79 | HR 76 | Ht 63.0 in | Wt 189.0 lb

## 2015-12-12 DIAGNOSIS — S83206D Unspecified tear of unspecified meniscus, current injury, right knee, subsequent encounter: Secondary | ICD-10-CM | POA: Diagnosis not present

## 2015-12-12 DIAGNOSIS — M25561 Pain in right knee: Secondary | ICD-10-CM | POA: Diagnosis not present

## 2015-12-12 DIAGNOSIS — M179 Osteoarthritis of knee, unspecified: Secondary | ICD-10-CM

## 2015-12-12 DIAGNOSIS — M1711 Unilateral primary osteoarthritis, right knee: Secondary | ICD-10-CM

## 2015-12-12 NOTE — Telephone Encounter (Signed)
Pt saw Md today and states that she will have surgery soon on her knee, please d/c from PT

## 2015-12-12 NOTE — Progress Notes (Signed)
Patient ZO:XWRUEAV:Tracy Mckee, female DOB:04-11-1941, 75 y.o. WUJ:[id number]  Chief Complaint  Patient presents with  . Follow-up    1 week follow up right knee    HPI  Tracy Mckee is a [age] y.o. female who has known tear of the medial meniscus from MRI in [place] in October of 2016.  She has been to PT and is worse.  She has giving way and pain of the right knee.  She needs arthroscopy.  I had discussed this with her last time.  She wanted to try PT again.  I will have her see Dr. Romeo AppleHarrison for consideration of arthroscopy.  I will give Rx for walker.  HPI  Body mass index is 33.49 kg/(m^2).  ROS  Review of Systems  HENT: Negative for congestion.   Respiratory: Negative for cough and shortness of breath.   Cardiovascular: Negative for chest pain and leg swelling.  Endocrine: Positive for cold intolerance.  Musculoskeletal: Positive for joint swelling, arthralgias and gait problem.  Allergic/Immunologic: Positive for environmental allergies.  Neurological: Negative for numbness.    Past Medical History  Diagnosis Date  . Diverticulitis   . Diabetes mellitus without complication (HCC)   . Hyperlipidemia   . Hypertension   . Edema   . GERD (gastroesophageal reflux disease)   . Knee pain     Past Surgical History  Procedure Laterality Date  . Ovarian cyst removal    . Esophageal dilation      History reviewed. No pertinent family history.  Social History Social History  Substance Use Topics  . Smoking status: Never Smoker   . Smokeless tobacco: None  . Alcohol Use: Yes     Comment: occassionally    Allergies  Allergen Reactions  . Baclofen   . Ciprofloxacin   . Hydrocodone-Acetaminophen Other (See Comments)    Patient states it made her feel like she was having a heart attack  . Other Swelling    Chest tightness-2 unknown antibiotics (One starts with a "B" and the other, a "C")    Current Outpatient Prescriptions  Medication Sig Dispense Refill  .  fluticasone (FLONASE) 50 MCG/ACT nasal spray Place into both nostrils daily.    Marland Kitchen. glimepiride (AMARYL) 2 MG tablet Take 2 mg by mouth daily with breakfast.    . metFORMIN (GLUCOPHAGE-XR) 500 MG 24 hr tablet Take 500 mg by mouth 2 (two) times daily.    Marland Kitchen. omeprazole (PRILOSEC) 40 MG capsule Take 40 mg by mouth daily.    . traMADol (ULTRAM) 50 MG tablet Take 1 tablet (50 mg total) by mouth every 6 (six) hours as needed. 60 tablet 3  . triamterene-hydrochlorothiazide (MAXZIDE-25) 37.5-25 MG per tablet Take 1 tablet by mouth daily.     No current facility-administered medications for this visit.     Physical Exam  Blood pressure 145/79, pulse 76, height 5\' 3"  (1.6 m), weight 189 lb (85.73 kg).  Constitutional: overall normal hygiene, normal nutrition, well developed, normal grooming, normal body habitus. Assistive device:cane  Musculoskeletal: gait and station Limp right, muscle tone and strength are normal, no tremors or atrophy is present.  .  Neurological: coordination overall normal.  Deep tendon reflex/nerve stretch intact.  Sensation normal.  Cranial nerves II-XII intact.   Skin:   normal overall no scars, lesions, ulcers or rashes. No psoriasis.  Psychiatric: Alert and oriented x 3.  Recent memory intact, remote memory unclear.  Normal mood and affect. Well groomed.  Good eye contact.  Cardiovascular: overall no  swelling, no varicosities, no edema bilaterally, normal temperatures of the legs and arms, no clubbing, cyanosis and good capillary refill.  Lymphatic: palpation is normal.  The right lower extremity is examined:  Inspection:  Thigh:  Non-tender and no defects  Knee has swelling 2+ effusion.                        Joint tenderness is present                        Patient is tender over the medial joint line  Lower Leg:  Has normal appearance and no tenderness or defects  Ankle:  Non-tender and no defects  Foot:  Non-tender and no defects Range of Motion:  Knee:   Range of motion is: 0-100                        Crepitus is  present  Ankle:  Range of motion is normal. Strength and Tone:  The right lower extremity has normal strength and tone. Stability:  Knee:  The knee has positive medial McMurray.  Ankle:  The ankle is stable.    The patient has been educated about the nature of the problem(s) and counseled on treatment options.  The patient appeared to understand what I have discussed and is in agreement with it.  Encounter Diagnoses  Name Primary?  . Meniscus tear, right, subsequent encounter Yes  . Osteoarthritis of right knee, unspecified osteoarthritis type   . Right knee pain     PLAN Call if any problems.  Precautions discussed.  Continue current medications.   Return to clinic See Dr. Romeo AppleHarrison for consideration of arthroscopy of the right knee.   Electronically Signed Darreld McleanWayne Izreal Kock, MD 6/27/20172:06 PM

## 2015-12-14 ENCOUNTER — Encounter (HOSPITAL_COMMUNITY): Payer: PPO | Admitting: Physical Therapy

## 2015-12-21 ENCOUNTER — Encounter (HOSPITAL_COMMUNITY): Payer: PPO | Admitting: Physical Therapy

## 2015-12-27 ENCOUNTER — Encounter (HOSPITAL_COMMUNITY): Payer: PPO | Admitting: Physical Therapy

## 2015-12-28 ENCOUNTER — Encounter: Payer: Self-pay | Admitting: Orthopedic Surgery

## 2016-01-02 ENCOUNTER — Ambulatory Visit (INDEPENDENT_AMBULATORY_CARE_PROVIDER_SITE_OTHER): Payer: PPO | Admitting: Orthopedic Surgery

## 2016-01-02 ENCOUNTER — Encounter: Payer: Self-pay | Admitting: Orthopedic Surgery

## 2016-01-02 VITALS — BP 137/76 | Ht 63.0 in | Wt 190.0 lb

## 2016-01-02 DIAGNOSIS — S83206D Unspecified tear of unspecified meniscus, current injury, right knee, subsequent encounter: Secondary | ICD-10-CM

## 2016-01-02 DIAGNOSIS — M1711 Unilateral primary osteoarthritis, right knee: Secondary | ICD-10-CM

## 2016-01-02 DIAGNOSIS — M179 Osteoarthritis of knee, unspecified: Secondary | ICD-10-CM | POA: Diagnosis not present

## 2016-01-02 NOTE — Progress Notes (Signed)
Patient ID: Tracy MorrowShirley A Mckee, female   DOB: [birth year], 75 y.o.   MRN: [id number]  Chief Complaint  Patient presents with  . Knee Pain    right knee pain, referred by Dr Hilda LiasKeeling    HPI   75 year old female presents for evaluation of right knee pain she's had an MRI which shows she has a medial meniscus which coincides with her symptoms of medial knee pain inability to weight-bear loss of motion and mechanical symptoms.  She was treated adequately nonoperatively with standard measures including but not limited to NSAIDs rest cane walker and did not improve  Her knee has come symptomatic enough to prevent her from working and doing normal activities    Review of Systems  Constitutional: Negative for fever and chills.  Eyes: Negative for photophobia.  Respiratory: Negative for shortness of breath.   Cardiovascular: Negative for chest pain.  Gastrointestinal: Negative for heartburn.  Genitourinary: Negative for dysuria.  Musculoskeletal: Positive for joint pain.  Skin: Negative for rash.  Neurological: Negative for dizziness and headaches.  Endo/Heme/Allergies: Negative for environmental allergies and polydipsia. Does not bruise/bleed easily.  Psychiatric/Behavioral: Negative for depression.    Past Medical History  Diagnosis Date  . Diverticulitis   . Diabetes mellitus without complication (HCC)   . Hyperlipidemia   . Hypertension   . Edema   . GERD (gastroesophageal reflux disease)   . Knee pain     BP 137/76 mmHg  Ht 5\' 3"  (1.6 m)  Wt 190 lb (86.183 kg)  BMI 33.67 kg/m2  Physical Exam  Musculoskeletal:       Left knee: She exhibits no effusion.   Physical Exam  Constitutional: The patient appears well-developed and well-nourished. No distress.  The patient is oriented to person, place, and time.  Psychiatric: The patient has a normal mood and affect.  Cardiovascular: Intact distal pulses.   Neurological: sensation is normal  Skin: Skin is warm and dry. No rash  noted. The patient is not diaphoretic. No erythema. No pallor.    Right Knee Exam   Tenderness  The patient is experiencing tenderness in the medial joint line.  Range of Motion  Extension: 10  Flexion: 120   Muscle Strength   The patient has normal right knee strength.  Tests  McMurray:  Medial - positive  Lachman:  Anterior - negative    Posterior - negative Drawer:       Anterior - negative    Posterior - negative Varus: negative Valgus: negative  Other  Erythema: absent Scars: absent Sensation: normal Pulse: present Swelling: none   Left Knee Exam   Tenderness  The patient is experiencing no tenderness.     Range of Motion  The patient has normal left knee ROM.  Muscle Strength   The patient has normal left knee strength.  Tests  McMurray:  Medial - negative  Lachman:  Anterior - negative    Posterior - negative Drawer:       Anterior - negative     Posterior - negative Varus: negative Valgus: negative  Other  Erythema: absent Sensation: normal Pulse: present Swelling: none Effusion: no effusion present      ASSESSMENT AND PLAN   Medial meniscus tear right knee with mild OA  Plan arthroscopy right knee and medial meniscectomy  This procedure has been fully reviewed with the patient and written informed consent has been obtained.   Fuller CanadaStanley Renwick Asman, MD 01/02/2016 10:14 AM

## 2016-01-02 NOTE — Patient Instructions (Signed)
You have decided to proceed with operative arthroscopy of the knee. You have decided not to continue with nonoperative measures such as but not limited to oral medication, weight loss, activity modification, physical therapy, bracing, or injection.  We will perform operative arthroscopy of the knee. Some of the risks associated with arthroscopic surgery of the knee include but are not limited to Bleeding Infection Swelling Stiffness Blood clot Pain  If you're not comfortable with these risks and would like to continue with nonoperative treatment please let Dr. Ivann Trimarco know prior to your surgery. 

## 2016-01-03 ENCOUNTER — Encounter (HOSPITAL_COMMUNITY): Payer: PPO

## 2016-01-05 NOTE — Patient Instructions (Addendum)
Tracy Mckee  01/05/2016     @PREFPERIOPPHARMACY @   Your procedure is scheduled on  01/10/2016   [facility] at  1110  A.M.  Call this number if you have problems the morning of surgery:  [phone]   Remember:  Do not eat food or drink liquids after midnight.  Take these medicines the morning of surgery with A SIP OF WATER  Prilosec, ultram  Do not wear jewelry, make-up or nail polish.  Do not wear lotions, powders, or perfumes.  You may wear deoderant.  Do not shave 48 hours prior to surgery.  Men may shave face and neck.  Do not bring valuables to the hospital.  [facility] is not responsible for any belongings or valuables.  Contacts, dentures or bridgework may not be worn into surgery.  Leave your suitcase in the car.  After surgery it may be brought to your room.  For patients admitted to the hospital, discharge time will be determined by your treatment team.  Patients discharged the day of surgery will not be allowed to drive home.   Name and phone number of your driver:   family Special instructions:  none  Please read over the following fact sheets that you were given. Coughing and Deep Breathing, Surgical Site Infection Prevention, Anesthesia Post-op Instructions and Care and Recovery After Surgery      Meniscus Injury, Arthroscopy Arthroscopy is a surgical procedure that involves the use of a small scope that has a camera and surgical instruments on the end (arthroscope). An arthroscope can be used to repair your meniscus injury.  LET [facility] KNOW ABOUT:  Any allergies you have.  All medicines you are taking, including vitamins, herbs, eyedrops, creams, and over-the-counter medicines.  Any recent colds or infections you have had or currently have.  Previous problems you or members of your family have had with the use of anesthetics.  Any blood disorders or blood clotting problems you have.  Previous surgeries  you have had.  Medical conditions you have. RISKS AND COMPLICATIONS Generally, this is a safe procedure. However, as with any procedure, problems can occur. Possible problems include:  Damage to nerves or blood vessels.  Excess bleeding.  Blood clots.  Infection. BEFORE THE PROCEDURE  Do not eat or drink for 6-8 hours before the procedure.  Take medicines as directed by your surgeon. Ask your surgeon about changing or stopping your regular medicines.  You may have lab tests the morning of surgery. PROCEDURE  You will be given one of the following:   A medicine that numbs the area (local anesthesia).  A medicine that makes you go to sleep (general anesthesia).  A medicine injected into your spine that numbs your body below the waist (spinal anesthesia). Most often, several small cuts (incisions) are made in the knee. The arthroscope and instruments go into the incisions to repair the damage. The torn portion of the meniscus is removed.  During this time, your surgeon may find a partial or complete tear in a cruciate ligament, such as the anterior cruciate ligament (ACL). A completely torn cruciate ligament is reconstructed by taking tissue from another part of the body (grafting) and placing it into the injured area. This requires several larger incisions to complete the repair. Sometimes, open surgery is needed for collateral ligament injuries. If a collateral ligament is found to be injured, your surgeon may staple or suture the tear through  a slightly larger incision on the side of the knee. AFTER THE PROCEDURE You will be taken to the recovery area where your progress will be monitored. When you are awake, stable, and taking fluids without complications, you will be allowed to go home. This is usually the same day. However, more extensive repairs of a ligament may require an overnight stay.  The recovery time after repairing your meniscus or ligament depends on the amount of damage  to these structures. It also depends on whether or not reconstructive knee surgery was needed.   A torn or stretched ligament (ligament sprain) may take 6-8 weeks to heal. It takes about the same amount of time if your surgeon removed a torn meniscus.  A repaired meniscus may require 6-12 weeks of recovery time.  A torn ligament needing reconstructive surgery may take 6-12 months to heal fully.   This information is not intended to replace advice given to you by your health care provider. Make sure you discuss any questions you have with your health care provider.   Document Released: 05/31/2000 Document Revised: 06/08/2013 Document Reviewed: 10/30/2012 Elsevier Interactive Patient Education 2016 [employer]. Arthroscopy, With Meniscus Injury, Care After Refer to this sheet in the next few weeks. These instructions provide you with general information on caring for yourself after your procedure. Your health care provider may also give you specific instructions. Your treatment has been planned according to the current medical practices, but problems sometimes occur. Call your health care provider if you have any problems or questions after your procedure. WHAT TO EXPECT AFTER THE PROCEDURE After your procedure, it is typical to have the following:  Pain and swelling in your knee.  Constipation.  Difficulty walking. HOME CARE INSTRUCTIONS   Use crutches and do knee exercises as directed by your health care provider.  Apply ice to the injured area:  Put ice in a plastic bag.  Place a towel between your skin and the bag.  Leave the ice on for 15-20 minutes, 3-4 times a day while awake. Do this for the first 2 days.  Rest and raise (elevate) your knee.  Change bandages (dressings) as directed by your health care provider.  Keep the wound dry and clean. The wound may be washed gently with soap and water. Gently blot or dab the wound dry. It is okay to take showers 24-48 hours after  surgery. Do not take baths, use swimming pools, or use hot tubs for 14 days, or as directed by your health care provider.  Only take over-the-counter or prescription medicines for pain, discomfort, or fever as directed by your health care provider.  Continue your normal diet as directed by your health care provider.  Do not lift anything more than 10 pounds or play contact sports for 3 weeks, or as directed by your health care provider.  If a brace was applied, use as directed by your health care provider.  Your health care provider will help with instructions for rehabilitation of your knee. SEEK MEDICAL CARE IF:   You have increased bleeding (more than a small spot) from the wound.  You have redness, swelling, or increasing pain in the wound.  Yellowish-white fluid (pus) is coming from your wound. SEEK IMMEDIATE MEDICAL CARE IF:   You develop a rash.  You have a fever or persistent symptoms for more than 2-3 days.  You have difficulty breathing.  You have increasing pain with movement of the knee. MAKE SURE YOU:   Understand these  instructions.  Will watch your condition.  Will get help right away if you are not doing well or get worse.   This information is not intended to replace advice given to you by your health care provider. Make sure you discuss any questions you have with your health care provider.   Document Released: 12/21/2004 Document Revised: 02/03/2013 Document Reviewed: 11/10/2012 Elsevier Interactive Patient Education 2016 [employer] PATIENT INSTRUCTIONS POST-ANESTHESIA  IMMEDIATELY FOLLOWING SURGERY:  Do not drive or operate machinery for the first twenty four hours after surgery.  Do not make any important decisions for twenty four hours after surgery or while taking narcotic pain medications or sedatives.  If you develop intractable nausea and vomiting or a severe headache please notify your doctor immediately.  FOLLOW-UP:  Please make an  appointment with your surgeon as instructed. You do not need to follow up with anesthesia unless specifically instructed to do so.  WOUND CARE INSTRUCTIONS (if applicable):  Keep a dry clean dressing on the anesthesia/puncture wound site if there is drainage.  Once the wound has quit draining you may leave it open to air.  Generally you should leave the bandage intact for twenty four hours unless there is drainage.  If the epidural site drains for more than 36-48 hours please call the anesthesia department.  QUESTIONS?:  Please feel free to call your physician or the hospital operator if you have any questions, and they will be happy to assist you.

## 2016-01-08 ENCOUNTER — Encounter (HOSPITAL_COMMUNITY): Payer: Self-pay

## 2016-01-08 ENCOUNTER — Other Ambulatory Visit: Payer: Self-pay

## 2016-01-08 ENCOUNTER — Encounter (HOSPITAL_COMMUNITY)
Admission: RE | Admit: 2016-01-08 | Discharge: 2016-01-08 | Disposition: A | Discharge status: Home / Self Care | Payer: PPO | Source: Ambulatory Visit | Attending: Orthopedic Surgery | Admitting: Orthopedic Surgery

## 2016-01-08 DIAGNOSIS — I10 Essential (primary) hypertension: Secondary | ICD-10-CM | POA: Diagnosis not present

## 2016-01-08 DIAGNOSIS — E119 Type 2 diabetes mellitus without complications: Secondary | ICD-10-CM | POA: Diagnosis not present

## 2016-01-08 DIAGNOSIS — Z79899 Other long term (current) drug therapy: Secondary | ICD-10-CM | POA: Diagnosis not present

## 2016-01-08 DIAGNOSIS — Z7984 Long term (current) use of oral hypoglycemic drugs: Secondary | ICD-10-CM | POA: Diagnosis not present

## 2016-01-08 DIAGNOSIS — K219 Gastro-esophageal reflux disease without esophagitis: Secondary | ICD-10-CM | POA: Diagnosis not present

## 2016-01-08 DIAGNOSIS — S83241A Other tear of medial meniscus, current injury, right knee, initial encounter: Secondary | ICD-10-CM | POA: Diagnosis present

## 2016-01-08 DIAGNOSIS — X58XXXA Exposure to other specified factors, initial encounter: Secondary | ICD-10-CM | POA: Diagnosis not present

## 2016-01-08 DIAGNOSIS — M1711 Unilateral primary osteoarthritis, right knee: Secondary | ICD-10-CM | POA: Diagnosis not present

## 2016-01-08 DIAGNOSIS — S83281A Other tear of lateral meniscus, current injury, right knee, initial encounter: Secondary | ICD-10-CM | POA: Diagnosis not present

## 2016-01-08 LAB — BASIC METABOLIC PANEL
ANION GAP: 7 (ref 5–15)
BUN: 20 mg/dL (ref 6–20)
CALCIUM: 9.2 mg/dL (ref 8.9–10.3)
CHLORIDE: 101 mmol/L (ref 101–111)
CO2: 26 mmol/L (ref 22–32)
Creatinine, Ser: 0.88 mg/dL (ref 0.44–1.00)
GFR calc Af Amer: >60 mL/min (ref >60)
GFR calc non Af Amer: >60 mL/min (ref >60)
GLUCOSE: 200 mg/dL — AB (ref 65–99)
POTASSIUM: 4.3 mmol/L (ref 3.5–5.1)
Sodium: 134 mmol/L — ABNORMAL LOW (ref 135–145)

## 2016-01-08 LAB — CBC
HEMATOCRIT: 40.7 % (ref 36.0–46.0)
Hemoglobin: 14 g/dL (ref 12.0–15.0)
MCH: 28.7 pg (ref 26.0–34.0)
MCHC: 34.4 g/dL (ref 30.0–36.0)
MCV: 83.6 fL (ref 78.0–100.0)
Platelets: 171 10*3/uL (ref 150–400)
RBC: 4.87 MIL/uL (ref 3.87–5.11)
RDW: 12.9 % (ref 11.5–15.5)
WBC: 6.1 10*3/uL (ref 4.0–10.5)

## 2016-01-09 NOTE — H&P (Signed)
Chief Complaint  Patient presents with  . Knee Pain      right knee pain, referred by Dr Hilda Lias      HPI   75 year old female presents for evaluation of right knee pain she's had an MRI which shows she has a medial meniscus which coincides with her symptoms of medial knee pain inability to weight-bear loss of motion and mechanical symptoms.   She was treated adequately nonoperatively with standard measures including but not limited to NSAIDs rest cane walker and did not improve   Her knee has come symptomatic enough to prevent her from working and doing normal activities     Review of Systems  Constitutional: Negative for fever and chills.  Eyes: Negative for photophobia.  Respiratory: Negative for shortness of breath.   Cardiovascular: Negative for chest pain.  Gastrointestinal: Negative for heartburn.  Genitourinary: Negative for dysuria.  Musculoskeletal: Positive for joint pain.  Skin: Negative for rash.  Neurological: Negative for dizziness and headaches.  Endo/Heme/Allergies: Negative for environmental allergies and polydipsia. Does not bruise/bleed easily.  Psychiatric/Behavioral: Negative for depression.     Past Surgical History:  Procedure Laterality Date  . ESOPHAGEAL DILATION    . OVARIAN CYST REMOVAL     Family history is negative for serious medical conditions apart kidney or lung  Social History  Substance Use Topics  . Smoking status: Never Smoker  . Smokeless tobacco: Never Used  . Alcohol use Yes     Comment: occassionally           Past Medical History  Diagnosis Date  . Diverticulitis    . Diabetes mellitus without complication (HCC)    . Hyperlipidemia    . Hypertension    . Edema    . GERD (gastroesophageal reflux disease)    . Knee pain        BP 137/76 mmHg  Ht  (1.6 m)  Wt 190 lb (86.183 kg)  BMI 33.67 kg/m2   Physical Exam  Musculoskeletal:       Left knee: She exhibits no effusion.    Physical Exam  Constitutional:  The patient appears well-developed and well-nourished. No distress.  The patient is oriented to person, place, and time.  Psychiatric: The patient has a normal mood and affect.  Cardiovascular: Intact distal pulses.   Neurological: sensation is normal  Skin: Skin is warm and dry. No rash noted. The patient is not diaphoretic. No erythema. No pallor.      Left and right upper extremity no contracture subluxation atrophy tremor skin changes or neurovascular deficits. Right Knee Exam    Tenderness  The patient is experiencing tenderness in the medial joint line.   Range of Motion  Extension: 10  Flexion: 120    Muscle Strength    The patient has normal right knee strength.   Tests  McMurray:  Medial - positive  Lachman:  Anterior - negative    Posterior - negative Drawer:       Anterior - negative    Posterior - negative Varus: negative Valgus: negative   Other  Erythema: absent Scars: absent Sensation: normal Pulse: present Swelling: none     Left Knee Exam    Tenderness  The patient is experiencing no tenderness.        Range of Motion  The patient has normal left knee ROM.   Muscle Strength    The patient has normal left knee strength.   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

## 2016-01-10 ENCOUNTER — Ambulatory Visit (HOSPITAL_COMMUNITY): Payer: PPO | Admitting: Anesthesiology

## 2016-01-10 ENCOUNTER — Ambulatory Visit (HOSPITAL_COMMUNITY)
Admission: RE | Admit: 2016-01-10 | Discharge: 2016-01-10 | Disposition: A | Discharge status: Home / Self Care | Payer: PPO | Source: Ambulatory Visit | Attending: Orthopedic Surgery | Admitting: Orthopedic Surgery

## 2016-01-10 ENCOUNTER — Encounter (HOSPITAL_COMMUNITY)
Admission: RE | Disposition: A | Discharge status: Home / Self Care | Payer: Self-pay | Source: Ambulatory Visit | Attending: Orthopedic Surgery

## 2016-01-10 ENCOUNTER — Telehealth: Payer: Self-pay | Admitting: *Deleted

## 2016-01-10 ENCOUNTER — Encounter (HOSPITAL_COMMUNITY): Payer: PPO

## 2016-01-10 DIAGNOSIS — S83241A Other tear of medial meniscus, current injury, right knee, initial encounter: Secondary | ICD-10-CM | POA: Diagnosis not present

## 2016-01-10 DIAGNOSIS — Z7984 Long term (current) use of oral hypoglycemic drugs: Secondary | ICD-10-CM | POA: Insufficient documentation

## 2016-01-10 DIAGNOSIS — X58XXXA Exposure to other specified factors, initial encounter: Secondary | ICD-10-CM | POA: Insufficient documentation

## 2016-01-10 DIAGNOSIS — S83281A Other tear of lateral meniscus, current injury, right knee, initial encounter: Secondary | ICD-10-CM | POA: Insufficient documentation

## 2016-01-10 DIAGNOSIS — M23300 Other meniscus derangements, unspecified lateral meniscus, right knee: Secondary | ICD-10-CM | POA: Diagnosis not present

## 2016-01-10 DIAGNOSIS — M23321 Other meniscus derangements, posterior horn of medial meniscus, right knee: Secondary | ICD-10-CM

## 2016-01-10 DIAGNOSIS — E119 Type 2 diabetes mellitus without complications: Secondary | ICD-10-CM | POA: Diagnosis not present

## 2016-01-10 DIAGNOSIS — M1711 Unilateral primary osteoarthritis, right knee: Secondary | ICD-10-CM | POA: Diagnosis not present

## 2016-01-10 DIAGNOSIS — M23329 Other meniscus derangements, posterior horn of medial meniscus, unspecified knee: Secondary | ICD-10-CM

## 2016-01-10 DIAGNOSIS — M23306 Other meniscus derangements, unspecified meniscus, right knee: Secondary | ICD-10-CM | POA: Diagnosis not present

## 2016-01-10 DIAGNOSIS — K219 Gastro-esophageal reflux disease without esophagitis: Secondary | ICD-10-CM | POA: Insufficient documentation

## 2016-01-10 DIAGNOSIS — I10 Essential (primary) hypertension: Secondary | ICD-10-CM | POA: Insufficient documentation

## 2016-01-10 DIAGNOSIS — M23303 Other meniscus derangements, unspecified medial meniscus, right knee: Secondary | ICD-10-CM

## 2016-01-10 DIAGNOSIS — M23302 Other meniscus derangements, unspecified lateral meniscus, unspecified knee: Secondary | ICD-10-CM

## 2016-01-10 DIAGNOSIS — Z79899 Other long term (current) drug therapy: Secondary | ICD-10-CM | POA: Insufficient documentation

## 2016-01-10 HISTORY — PX: KNEE ARTHROSCOPY WITH MEDIAL MENISECTOMY: SHX5651

## 2016-01-10 LAB — GLUCOSE, CAPILLARY
GLUCOSE-CAPILLARY: 163 mg/dL — AB (ref 65–99)
Glucose-Capillary: 257 mg/dL — ABNORMAL HIGH (ref 65–99)

## 2016-01-10 SURGERY — ARTHROSCOPY, KNEE, WITH MEDIAL MENISCECTOMY
Anesthesia: General | Site: Knee | Laterality: Right

## 2016-01-10 MED ORDER — FENTANYL CITRATE (PF) 100 MCG/2ML IJ SOLN
INTRAMUSCULAR | Status: AC
  Filled 2016-01-10: qty 2

## 2016-01-10 MED ORDER — EPINEPHRINE HCL 1 MG/ML IJ SOLN
INTRAMUSCULAR | Status: AC
  Filled 2016-01-10: qty 5

## 2016-01-10 MED ORDER — LIDOCAINE HCL 1 % IJ SOLN
INTRAMUSCULAR | Status: DC | PRN
  Administered 2016-01-10: 25 mg via INTRADERMAL

## 2016-01-10 MED ORDER — TRAMADOL HCL 50 MG PO TABS
50.0000 mg | ORAL_TABLET | Freq: Once | ORAL | Status: AC
  Administered 2016-01-10: 50 mg via ORAL

## 2016-01-10 MED ORDER — CEFAZOLIN SODIUM-DEXTROSE 2-4 GM/100ML-% IV SOLN
INTRAVENOUS | Status: AC
  Filled 2016-01-10: qty 100

## 2016-01-10 MED ORDER — FENTANYL CITRATE (PF) 100 MCG/2ML IJ SOLN
25.0000 ug | INTRAMUSCULAR | Status: AC
  Administered 2016-01-10 (×2): 25 ug via INTRAVENOUS

## 2016-01-10 MED ORDER — KETOROLAC TROMETHAMINE 30 MG/ML IJ SOLN
30.0000 mg | Freq: Once | INTRAMUSCULAR | Status: AC
  Administered 2016-01-10: 30 mg via INTRAVENOUS

## 2016-01-10 MED ORDER — MIDAZOLAM HCL 2 MG/2ML IJ SOLN
INTRAMUSCULAR | Status: AC
  Filled 2016-01-10: qty 2

## 2016-01-10 MED ORDER — DEXAMETHASONE SODIUM PHOSPHATE 4 MG/ML IJ SOLN
INTRAMUSCULAR | Status: AC
  Filled 2016-01-10: qty 1

## 2016-01-10 MED ORDER — LABETALOL HCL 5 MG/ML IV SOLN
INTRAVENOUS | Status: AC
  Filled 2016-01-10: qty 4

## 2016-01-10 MED ORDER — ONDANSETRON HCL 4 MG/2ML IJ SOLN
4.0000 mg | Freq: Once | INTRAMUSCULAR | Status: DC | PRN
  Filled 2016-01-10: qty 2

## 2016-01-10 MED ORDER — EPINEPHRINE HCL 1 MG/ML IJ SOLN
INTRAMUSCULAR | Status: AC
  Filled 2016-01-10: qty 2

## 2016-01-10 MED ORDER — PROPOFOL 10 MG/ML IV BOLUS
INTRAVENOUS | Status: AC
  Filled 2016-01-10: qty 20

## 2016-01-10 MED ORDER — FENTANYL CITRATE (PF) 100 MCG/2ML IJ SOLN
INTRAMUSCULAR | Status: DC | PRN
  Administered 2016-01-10 (×2): 50 ug via INTRAVENOUS
  Administered 2016-01-10 (×3): 25 ug via INTRAVENOUS

## 2016-01-10 MED ORDER — FENTANYL CITRATE (PF) 100 MCG/2ML IJ SOLN
25.0000 ug | INTRAMUSCULAR | Status: DC | PRN
  Administered 2016-01-10: 25 ug via INTRAVENOUS
  Administered 2016-01-10: 50 ug via INTRAVENOUS
  Administered 2016-01-10: 25 ug via INTRAVENOUS
  Filled 2016-01-10: qty 2

## 2016-01-10 MED ORDER — PROPOFOL 10 MG/ML IV BOLUS
INTRAVENOUS | Status: DC | PRN
  Administered 2016-01-10: 140 mg via INTRAVENOUS

## 2016-01-10 MED ORDER — ONDANSETRON HCL 4 MG/2ML IJ SOLN
4.0000 mg | Freq: Once | INTRAMUSCULAR | Status: AC
  Administered 2016-01-10: 4 mg via INTRAVENOUS

## 2016-01-10 MED ORDER — BUPIVACAINE-EPINEPHRINE (PF) 0.5% -1:200000 IJ SOLN
INTRAMUSCULAR | Status: DC | PRN
  Administered 2016-01-10: 60 mL

## 2016-01-10 MED ORDER — TRAMADOL HCL 50 MG PO TABS
ORAL_TABLET | ORAL | Status: AC
  Filled 2016-01-10: qty 1

## 2016-01-10 MED ORDER — BUPIVACAINE-EPINEPHRINE (PF) 0.5% -1:200000 IJ SOLN
INTRAMUSCULAR | Status: AC
  Filled 2016-01-10: qty 60

## 2016-01-10 MED ORDER — ONDANSETRON HCL 4 MG/2ML IJ SOLN
INTRAMUSCULAR | Status: AC
  Filled 2016-01-10: qty 2

## 2016-01-10 MED ORDER — LIDOCAINE HCL (PF) 1 % IJ SOLN
INTRAMUSCULAR | Status: AC
  Filled 2016-01-10: qty 5

## 2016-01-10 MED ORDER — LACTATED RINGERS IV SOLN
INTRAVENOUS | Status: DC
  Administered 2016-01-10: 1000 mL via INTRAVENOUS

## 2016-01-10 MED ORDER — SODIUM CHLORIDE 0.9 % IR SOLN
Status: DC | PRN
  Administered 2016-01-10 (×6): 3000 mL

## 2016-01-10 MED ORDER — LABETALOL HCL 5 MG/ML IV SOLN
10.0000 mg | INTRAVENOUS | Status: DC | PRN
  Administered 2016-01-10: 10 mg via INTRAVENOUS

## 2016-01-10 MED ORDER — KETOROLAC TROMETHAMINE 30 MG/ML IJ SOLN
INTRAMUSCULAR | Status: AC
  Filled 2016-01-10: qty 1

## 2016-01-10 MED ORDER — MIDAZOLAM HCL 5 MG/5ML IJ SOLN
INTRAMUSCULAR | Status: DC | PRN
  Administered 2016-01-10: 2 mg via INTRAVENOUS

## 2016-01-10 MED ORDER — DEXAMETHASONE SODIUM PHOSPHATE 4 MG/ML IJ SOLN
4.0000 mg | Freq: Once | INTRAMUSCULAR | Status: AC
  Administered 2016-01-10: 4 mg via INTRAVENOUS

## 2016-01-10 MED ORDER — EPINEPHRINE HCL 1 MG/ML IJ SOLN
INTRAMUSCULAR | Status: AC
  Filled 2016-01-10: qty 1

## 2016-01-10 MED ORDER — MIDAZOLAM HCL 2 MG/2ML IJ SOLN
1.0000 mg | INTRAMUSCULAR | Status: DC | PRN
  Administered 2016-01-10: 2 mg via INTRAVENOUS

## 2016-01-10 MED ORDER — CEFAZOLIN SODIUM-DEXTROSE 2-4 GM/100ML-% IV SOLN
2.0000 g | INTRAVENOUS | Status: AC
  Administered 2016-01-10: 2 g via INTRAVENOUS

## 2016-01-10 MED ORDER — CHLORHEXIDINE GLUCONATE 4 % EX LIQD: 60.0000 mL | Freq: Once | CUTANEOUS | Status: DC

## 2016-01-10 SURGICAL SUPPLY — 48 items
BAG HAMPER (MISCELLANEOUS) ×3 IMPLANT
BANDAGE ELASTIC 6 LF NS (GAUZE/BANDAGES/DRESSINGS) ×3 IMPLANT
BANDAGE ELASTIC 6 VELCRO NS (GAUZE/BANDAGES/DRESSINGS) ×3 IMPLANT
BLADE AGGRESSIVE PLUS 4.0 (BLADE) ×3 IMPLANT
BLADE SURG SZ11 CARB STEEL (BLADE) ×3 IMPLANT
BNDG CMPR MED 5X6 ELC HKLP NS (GAUZE/BANDAGES/DRESSINGS) ×1
CHLORAPREP W/TINT 26ML (MISCELLANEOUS) ×4 IMPLANT
CLOTH BEACON ORANGE TIMEOUT ST (SAFETY) ×3 IMPLANT
COOLER CRYO IC GRAV AND TUBE (ORTHOPEDIC SUPPLIES) ×3 IMPLANT
CUFF CRYO KNEE18X23 MED (MISCELLANEOUS) ×2 IMPLANT
CUFF TOURNIQUET SINGLE 34IN LL (TOURNIQUET CUFF) ×2 IMPLANT
DECANTER SPIKE VIAL GLASS SM (MISCELLANEOUS) ×6 IMPLANT
GAUZE SPONGE 4X4 12PLY STRL (GAUZE/BANDAGES/DRESSINGS) ×3 IMPLANT
GAUZE SPONGE 4X4 16PLY XRAY LF (GAUZE/BANDAGES/DRESSINGS) ×3 IMPLANT
GAUZE XEROFORM 5X9 LF (GAUZE/BANDAGES/DRESSINGS) ×3 IMPLANT
GLOVE BIOGEL PI IND STRL 7.0 (GLOVE) ×1 IMPLANT
GLOVE BIOGEL PI INDICATOR 7.0 (GLOVE) ×4
GLOVE SKINSENSE NS SZ8.0 LF (GLOVE) ×2
GLOVE SKINSENSE STRL SZ8.0 LF (GLOVE) ×1 IMPLANT
GLOVE SS N UNI LF 8.5 STRL (GLOVE) ×3 IMPLANT
GOWN STRL REUS W/ TWL LRG LVL3 (GOWN DISPOSABLE) ×1 IMPLANT
GOWN STRL REUS W/TWL LRG LVL3 (GOWN DISPOSABLE) ×6
GOWN STRL REUS W/TWL XL LVL3 (GOWN DISPOSABLE) ×3 IMPLANT
IV NS IRRIG 3000ML ARTHROMATIC (IV SOLUTION) ×14 IMPLANT
KIT BLADEGUARD II DBL (SET/KITS/TRAYS/PACK) ×3 IMPLANT
KIT ROOM TURNOVER AP CYSTO (KITS) ×3 IMPLANT
MANIFOLD NEPTUNE II (INSTRUMENTS) ×3 IMPLANT
MARKER SKIN DUAL TIP RULER LAB (MISCELLANEOUS) ×3 IMPLANT
NDL HYPO 18GX1.5 BLUNT FILL (NEEDLE) ×1 IMPLANT
NDL HYPO 21X1.5 SAFETY (NEEDLE) ×1 IMPLANT
NDL SPNL 18GX3.5 QUINCKE PK (NEEDLE) ×1 IMPLANT
NEEDLE HYPO 18GX1.5 BLUNT FILL (NEEDLE) ×3 IMPLANT
NEEDLE HYPO 21X1.5 SAFETY (NEEDLE) ×3 IMPLANT
NEEDLE SPNL 18GX3.5 QUINCKE PK (NEEDLE) ×3 IMPLANT
PACK ARTHRO LIMB DRAPE STRL (MISCELLANEOUS) ×3 IMPLANT
PAD ABD 5X9 TENDERSORB (GAUZE/BANDAGES/DRESSINGS) ×3 IMPLANT
PAD ARMBOARD 7.5X6 YLW CONV (MISCELLANEOUS) ×3 IMPLANT
PADDING CAST COTTON 6X4 STRL (CAST SUPPLIES) ×3 IMPLANT
PADDING WEBRIL 6 STERILE (GAUZE/BANDAGES/DRESSINGS) ×3 IMPLANT
SET ARTHROSCOPY INST (INSTRUMENTS) ×3 IMPLANT
SET ARTHROSCOPY PUMP TUBE (IRRIGATION / IRRIGATOR) ×3 IMPLANT
SET BASIN LINEN APH (SET/KITS/TRAYS/PACK) ×3 IMPLANT
SUT ETHILON 3 0 FSL (SUTURE) ×3 IMPLANT
SYR 30ML LL (SYRINGE) ×3 IMPLANT
SYRINGE 10CC LL (SYRINGE) ×3 IMPLANT
TUBE CONNECTING 12'X1/4 (SUCTIONS) ×3
TUBE CONNECTING 12X1/4 (SUCTIONS) ×6 IMPLANT
WAND 50 DEG COVAC W/CORD (SURGICAL WAND) ×2 IMPLANT

## 2016-01-10 NOTE — Transfer of Care (Signed)
Immediate Anesthesia Transfer of Care Note  Patient: Tracy Mckee  Procedure(s) Performed: Procedure(s): KNEE ARTHROSCOPY WITH MEDIAL  AND LATERAL MENISECTOMY (Right)  Patient Location: PACU  Anesthesia Type:General  Level of Consciousness: sedated  Airway & Oxygen Therapy: Patient Spontanous Breathing and Patient connected to face mask oxygen  Post-op Assessment: Report given to RN and Post -op Vital signs reviewed and stable  Post vital signs: Reviewed and stable  Last Vitals:  Vitals:   01/10/16 1010 01/10/16 1015  BP: 140/72 137/62  Pulse:    Resp: 17 13  Temp:      Last Pain:  Vitals:   01/10/16 0915  PainSc: 3          Complications: No apparent anesthesia complications

## 2016-01-10 NOTE — Progress Notes (Signed)
Dr Marcos Eke notified of blood sugar of 257. Instructed to make sure family is aware of this and BP fluctuations.  Instruct them that pt needs to follow-up with  PCP. No further orders given.

## 2016-01-10 NOTE — Telephone Encounter (Signed)
Surgery 01/10/16 S[facility] MM, APPROVED BY Solmon Ice  AUTH # [id number]

## 2016-01-10 NOTE — Op Note (Signed)
01/10/2016  11:38 AM  PATIENT:  Tracy Mckee  75 y.o. female  PRE-OPERATIVE DIAGNOSIS:  RIGHT MEDIAL MENISCUS TEAR  POST-OPERATIVE DIAGNOSIS:  RIGHT MEDIAL AND LATERAL MENISCUS TEAR  PROCEDURE:  Procedure(s): KNEE ARTHROSCOPY WITH MEDIAL  AND LATERAL MENISECTOMY (Right)  Surgical findings  #1 torn medial meniscus posterior horn. 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 She was given appropriate amount of Ancef based on her weight. She had general anesthesia without complication. In supine position the right leg was prepped and draped sterilely  Timeout was completed  A lateral portal was made and the scope was placed into the joint. The joint was viewed circumferentially with pathology as described above. A medial portal was established and the posterior horn meniscal tear was resected meniscal fragments were removed with a shaver and a 50 ArthroCare wand was used to balance the meniscus until a stable rim was confirmed by probe.  A chondroplasty was performed on loose cartilaginous articular tissue.  The scope was then placed laterally and the shaver was placed into the joint to debride the free edge tear of the lateral meniscus at the posterior horn and anterior horn. Stable rim was confirmed with the probe.  A pigmented synovial lesion was found in the suprapatellar pouch this was removed with the shaver. Coagulated base was performed with ArthroCare wand. The joint was then irrigated with copious amounts of saline. 60 mL of Marcaine with epinephrine injected into the joint. Portals closed with 3-0 nylon 2.  Sterile dressing applied. Cryo/Cuff placed and activated. Patient extubated taken recovery room in stable  condition SURGEON:  Surgeon(s) and Role:    * Vickki Hearing, MD - Primary  PHYSICIAN ASSISTANT:   ASSISTANTS: none   ANESTHESIA:   general  EBL:  Total I/O In: 600 [I.V.:600] Out: 5 [Blood:5]  BLOOD ADMINISTERED:none  DRAINS: none   LOCAL MEDICATIONS USED:  MARCAINE     SPECIMEN:  No Specimen  DISPOSITION OF SPECIMEN:  N/A  COUNTS:  YES  TOURNIQUET:    DICTATION: .Dragon Dictation  PLAN OF CARE: Discharge to home after PACU  PATIENT DISPOSITION:  PACU - hemodynamically stable.   Delay start of Pharmacological VTE agent (>24hrs) due to surgical blood loss or risk of bleeding: not applicable

## 2016-01-10 NOTE — Interval H&P Note (Signed)
History and Physical Interval Note:  01/10/2016 9:42 AM  Tracy Mckee  has presented today for surgery, with the diagnosis of RIGHT MEDIAL MENISCUS TEAR  The various methods of treatment have been discussed with the patient and family. After consideration of risks, benefits and other options for treatment, the patient has consented to  Procedure(s) with comments: KNEE ARTHROSCOPY WITH MEDIAL MENISECTOMY (Right) - no crutch training as a surgical intervention .  The patient's history has been reviewed, patient examined, no change in status, stable for surgery.  I have reviewed the patient's chart and labs.  Questions were answered to the patient's satisfaction.     Fuller Canada

## 2016-01-10 NOTE — Brief Op Note (Signed)
01/10/2016  11:38 AM  PATIENT:  Tracy Mckee  75 y.o. female  PRE-OPERATIVE DIAGNOSIS:  RIGHT MEDIAL MENISCUS TEAR  POST-OPERATIVE DIAGNOSIS:  RIGHT MEDIAL AND LATERAL MENISCUS TEAR  PROCEDURE:  Procedure(s): KNEE ARTHROSCOPY WITH MEDIAL  AND LATERAL MENISECTOMY (Right)  Surgical findings  #1 torn medial meniscus posterior horn. 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 She was given appropriate amount of Ancef based on her weight. She had general anesthesia without complication. In supine position the right leg was prepped and draped sterilely  Timeout was completed  A lateral portal was made and the scope was placed into the joint. The joint was viewed circumferentially with pathology as described above. A medial portal was established and the posterior horn meniscal tear was resected meniscal fragments were removed with a shaver and a 50 ArthroCare wand was used to balance the meniscus until a stable rim was confirmed by probe.  A chondroplasty was performed on loose cartilaginous articular tissue.  The scope was then placed laterally and the shaver was placed into the joint to debride the free edge tear of the lateral meniscus at the posterior horn and anterior horn. Stable rim was confirmed with the probe.  A pigmented synovial lesion was found in the suprapatellar pouch this was removed with the shaver. Coagulated base was performed with ArthroCare wand. The joint was then irrigated with copious amounts of saline. 60 mL of Marcaine with epinephrine injected into the joint. Portals closed with 3-0 nylon 2.  Sterile dressing applied. Cryo/Cuff placed and activated. Patient extubated taken recovery room in stable  condition SURGEON:  Surgeon(s) and Role:    * Latravis Grine E Lu Paradise, MD - Primary  PHYSICIAN ASSISTANT:   ASSISTANTS: none   ANESTHESIA:   general  EBL:  Total I/O In: 600 [I.V.:600] Out: 5 [Blood:5]  BLOOD ADMINISTERED:none  DRAINS: none   LOCAL MEDICATIONS USED:  MARCAINE     SPECIMEN:  No Specimen  DISPOSITION OF SPECIMEN:  N/A  COUNTS:  YES  TOURNIQUET:    DICTATION: .Dragon Dictation  PLAN OF CARE: Discharge to home after PACU  PATIENT DISPOSITION:  PACU - hemodynamically stable.   Delay start of Pharmacological VTE agent (>24hrs) due to surgical blood loss or risk of bleeding: not applicable  

## 2016-01-10 NOTE — Progress Notes (Signed)
Dr Jayme Cloud in to see patient regarding petechia below blood pressure cuff left arm.   Reviewed with patient the need to follow up with Dr Selena Batten about hypertension and high blood sugars.

## 2016-01-10 NOTE — Anesthesia Procedure Notes (Signed)
Procedure Name: LMA Insertion Date/Time: 01/10/2016 10:32 AM Performed by: Despina Hidden Pre-anesthesia Checklist: Patient identified, Timeout performed, Emergency Drugs available, Suction available and Patient being monitored Patient Re-evaluated:Patient Re-evaluated prior to inductionOxygen Delivery Method: Circle system utilized Preoxygenation: Pre-oxygenation with 100% oxygen Intubation Type: IV induction Ventilation: Mask ventilation without difficulty LMA: LMA inserted LMA Size: 4.0 Grade View: Grade II Number of attempts: 1 Placement Confirmation: positive ETCO2 and breath sounds checked- equal and bilateral Dental Injury: Teeth and Oropharynx as per pre-operative assessment

## 2016-01-10 NOTE — Anesthesia Preprocedure Evaluation (Signed)
Anesthesia Evaluation  Patient identified by MRN, date of birth, ID band Patient awake    Reviewed: Allergy & Precautions, NPO status , Patient's Chart, lab work & pertinent test results  Airway Mallampati: II  TM Distance: >3 FB     Dental  (+) Edentulous Upper   Pulmonary neg pulmonary ROS,    breath sounds clear to auscultation       Cardiovascular hypertension,  Rhythm:Regular Rate:Normal     Neuro/Psych    GI/Hepatic GERD  Controlled and Medicated,  Endo/Other  diabetes, Type 2, Oral Hypoglycemic Agents  Renal/GU      Musculoskeletal   Abdominal   Peds  Hematology   Anesthesia Other Findings   Reproductive/Obstetrics                             Anesthesia Physical Anesthesia Plan  ASA: II  Anesthesia Plan: General   Post-op Pain Management:    Induction: Intravenous  Airway Management Planned: LMA  Additional Equipment:   Intra-op Plan:   Post-operative Plan: Extubation in OR  Informed Consent: I have reviewed the patients History and Physical, chart, labs and discussed the procedure including the risks, benefits and alternatives for the proposed anesthesia with the patient or authorized representative who has indicated his/her understanding and acceptance.     Plan Discussed with:   Anesthesia Plan Comments:         Anesthesia Quick Evaluation

## 2016-01-10 NOTE — Discharge Instructions (Signed)
The medial meniscus was torn and a partial resection of torn tissue was performed  The lateral meniscus was also torn and the torn tissue was removed  You also have moderate arthritis in the medial side (inner portion) of your knee  Knee Arthroscopy, Care After Refer to this sheet in the next few weeks. These instructions provide you with information about caring for yourself after your procedure. Your health care provider may also give you more specific instructions. Your treatment has been planned according to current medical practices, but problems sometimes occur. Call your health care provider if you have any problems or questions after your procedure. WHAT TO EXPECT AFTER THE PROCEDURE After your procedure, it is common to have:  Soreness.  Pain. HOME CARE INSTRUCTIONS Bathing  Do not take baths, swim, or use a hot tub until your health care provider approves. Incision Care  There are many different ways to close and cover an incision, including stitches, skin glue, and adhesive strips. Follow your health care provider's instructions about:  Incision care.  Bandage (dressing) changes and removal.  Incision closure removal.  Check your incision area every day for signs of infection. Watch for:  Redness, swelling, or pain.  Fluid, blood, or pus. Activity  Avoid strenuous activities for as long as directed by your health care provider.  Return to your normal activities as directed by your health care provider. Ask your health care provider what activities are safe for you.  Perform range-of-motion exercises only as directed by your health care provider.  Do not lift anything that is heavier than 10 lb (4.5 kg).  Do not drive or operate heavy machinery while taking pain medicine.  If you were given crutches, use them as directed by your health care provider. Managing Pain, Stiffness, and Swelling  If directed, apply ice to the injured area:  Put ice in a plastic  bag.  Place a towel between your skin and the bag.  Leave the ice on for 20 minutes, 2-3 times per day.  Raise the injured area above the level of your heart while you are sitting or lying down as directed by your health care provider. General Instructions  Keep all follow-up visits as directed by your health care provider. This is important.  Take medicines only as directed by your health care provider.  Do not use any tobacco products, including cigarettes, chewing tobacco, or electronic cigarettes. If you need help quitting, ask your health care provider.  If you were given compression stockings, wear them as directed by your health care provider. These stockings help prevent blood clots and reduce swelling in your legs. SEEK MEDICAL CARE IF:  You have severe pain with any movement of your knee.  You notice a bad smell coming from the incision or dressing.  You have redness, swelling, or pain at the site of your incision.  You have fluid, blood, or pus coming from your incision. SEEK IMMEDIATE MEDICAL CARE IF:  You develop a rash.  You have a fever.  You have difficulty breathing or have shortness of breath.  You develop pain in your calves or in the back of your knee.  You develop chest pain.  You develop numbness or tingling in your leg or foot.   This information is not intended to replace advice given to you by your health care provider. Make sure you discuss any questions you have with your health care provider.   Document Released: 12/21/2004 Document Revised: 10/18/2014 Document Reviewed: 05/30/2014  Elsevier Interactive Patient Education [employer]  General Anesthesia, Adult, Care After Refer to this sheet in the next few weeks. These instructions provide you with information on caring for yourself after your procedure. Your health care provider may also give you more specific instructions. Your treatment has been planned according to current medical  practices, but problems sometimes occur. Call your health care provider if you have any problems or questions after your procedure. WHAT TO EXPECT AFTER THE PROCEDURE After the procedure, it is typical to experience:  Sleepiness.  Nausea and vomiting. HOME CARE INSTRUCTIONS  For the first 24 hours after general anesthesia:  Have a responsible person with you.  Do not drive a car. If you are alone, do not take public transportation.  Do not drink alcohol.  Do not take medicine that has not been prescribed by your health care provider.  Do not sign important papers or make important decisions.  You may resume a normal diet and activities as directed by your health care provider.  Change bandages (dressings) as directed.  If you have questions or problems that seem related to general anesthesia, call the hospital and ask for the anesthetist or anesthesiologist on call. SEEK MEDICAL CARE IF:  You have nausea and vomiting that continue the day after anesthesia.  You develop a rash. SEEK IMMEDIATE MEDICAL CARE IF:   You have difficulty breathing.  You have chest pain.  You have any allergic problems.   This information is not intended to replace advice given to you by your health care provider. Make sure you discuss any questions you have with your health care provider.   Document Released: 09/09/2000 Document Revised: 06/24/2014 Document Reviewed: 10/02/2011 Elsevier Interactive Patient Education [employer]

## 2016-01-10 NOTE — Interval H&P Note (Signed)
History and Physical Interval Note:  01/10/2016 9:41 AM  Tracy Mckee  has presented today for surgery, with the diagnosis of RIGHT MEDIAL MENISCUS TEAR  The various methods of treatment have been discussed with the patient and family. After consideration of risks, benefits and other options for treatment, the patient has consented to  Procedure(s) with comments: KNEE ARTHROSCOPY WITH MEDIAL MENISECTOMY (Right) - no crutch training as a surgical intervention .  The patient's history has been reviewed, patient examined, no change in status, stable for surgery.  I have reviewed the patient's chart and labs.  Questions were answered to the patient's satisfaction.     Fuller Canada

## 2016-01-11 NOTE — Anesthesia Postprocedure Evaluation (Signed)
Anesthesia Post Note  Patient: Tracy Mckee  Procedure(s) Performed: Procedure(s) (LRB): KNEE ARTHROSCOPY WITH MEDIAL  AND LATERAL MENISECTOMY (Right)  Patient location during evaluation: PACU Anesthesia Type: General Level of consciousness: awake and alert, oriented and patient cooperative Pain management: pain level controlled Vital Signs Assessment: post-procedure vital signs reviewed and stable Respiratory status: spontaneous breathing, nonlabored ventilation and respiratory function stable Cardiovascular status: blood pressure returned to baseline Postop Assessment: no signs of nausea or vomiting Anesthetic complications: no    Last Vitals:  Vitals:   01/10/16 1315 01/10/16 1335  BP: (!) 157/64 (!) 175/73  Pulse:  75  Resp:  16  Temp:  36.4 C    Last Pain:  Vitals:   01/10/16 1335  TempSrc: Oral  PainSc:                  Flavio Lindroth J

## 2016-01-16 ENCOUNTER — Encounter: Payer: Self-pay | Admitting: Orthopedic Surgery

## 2016-01-16 ENCOUNTER — Ambulatory Visit (INDEPENDENT_AMBULATORY_CARE_PROVIDER_SITE_OTHER): Payer: PPO | Admitting: Orthopedic Surgery

## 2016-01-16 VITALS — BP 146/83 | HR 116 | Ht 63.0 in | Wt 195.0 lb

## 2016-01-16 DIAGNOSIS — Z9889 Other specified postprocedural states: Secondary | ICD-10-CM

## 2016-01-16 DIAGNOSIS — K5732 Diverticulitis of large intestine without perforation or abscess without bleeding: Secondary | ICD-10-CM

## 2016-01-16 MED ORDER — CIPROFLOXACIN HCL 500 MG PO TABS: 500.0000 mg | ORAL_TABLET | Freq: Two times a day (BID) | ORAL | 1 refills | Status: AC

## 2016-01-16 MED ORDER — METRONIDAZOLE 500 MG PO TABS: 500.0000 mg | ORAL_TABLET | Freq: Three times a day (TID) | ORAL | 1 refills | Status: AC

## 2016-01-16 NOTE — Progress Notes (Signed)
Patient ID: Tracy Mckee, female   DOB: 1940/06/20, 75 y.o.   MRN: [id number]  Follow up visit  Chief Complaint  Patient presents with  . Routine Post Op    [facility] DOS 01/10/16    BP (!) 146/83   Pulse (!) 116   Ht 5\' 3"  (1.6 m)   Wt 195 lb (88.5 kg)   BMI 34.54 kg/m   Encounter Diagnosis  Name Primary?  . S/P right knee arthroscopy Yes   As far as her knee goes she is doing well. Portals look good she is flexing the knee 90 she is ready to get rid of her walker  Her major complaint is left lower quadrant pain. She has a history of diverticulitis treated with Flagyl and Cipro in the past. She has changed doctors and no longer sees Dr. Burnadette Peter and her new doctor has never seen her. She is advised to go to the emergency room if the antibiotic prescriptions that I gave her today did not help  Otherwise home exercises follow-up in 3 weeks    10:33 AM Fuller Canada, MD 01/16/2016

## 2016-01-16 NOTE — Patient Instructions (Signed)
Home therapy   Go to ER if antibiotics dont work   See Dr Selena Batten

## 2016-01-17 ENCOUNTER — Encounter (HOSPITAL_COMMUNITY): Payer: Self-pay | Admitting: Orthopedic Surgery

## 2016-01-21 ENCOUNTER — Encounter (HOSPITAL_COMMUNITY): Admission: EM | Disposition: E | Payer: Self-pay | Source: Home / Self Care | Attending: Interventional Cardiology

## 2016-01-21 ENCOUNTER — Ambulatory Visit (HOSPITAL_COMMUNITY): Admit: 2016-01-21 | Payer: Self-pay | Admitting: Interventional Cardiology

## 2016-01-21 ENCOUNTER — Encounter (HOSPITAL_COMMUNITY): Payer: Self-pay

## 2016-01-21 ENCOUNTER — Inpatient Hospital Stay (HOSPITAL_COMMUNITY)
Admission: EM | Admit: 2016-01-21 | Discharge: 2016-02-16 | DRG: 215 | Disposition: E | Payer: PPO | Attending: Interventional Cardiology | Admitting: Interventional Cardiology

## 2016-01-21 ENCOUNTER — Emergency Department (HOSPITAL_COMMUNITY): Payer: PPO

## 2016-01-21 DIAGNOSIS — E872 Acidosis: Secondary | ICD-10-CM | POA: Diagnosis present

## 2016-01-21 DIAGNOSIS — J80 Acute respiratory distress syndrome: Secondary | ICD-10-CM | POA: Diagnosis not present

## 2016-01-21 DIAGNOSIS — I2109 ST elevation (STEMI) myocardial infarction involving other coronary artery of anterior wall: Principal | ICD-10-CM | POA: Diagnosis present

## 2016-01-21 DIAGNOSIS — N179 Acute kidney failure, unspecified: Secondary | ICD-10-CM | POA: Diagnosis not present

## 2016-01-21 DIAGNOSIS — E1165 Type 2 diabetes mellitus with hyperglycemia: Secondary | ICD-10-CM | POA: Diagnosis present

## 2016-01-21 DIAGNOSIS — Z888 Allergy status to other drugs, medicaments and biological substances status: Secondary | ICD-10-CM

## 2016-01-21 DIAGNOSIS — D689 Coagulation defect, unspecified: Secondary | ICD-10-CM | POA: Diagnosis present

## 2016-01-21 DIAGNOSIS — R34 Anuria and oliguria: Secondary | ICD-10-CM | POA: Diagnosis not present

## 2016-01-21 DIAGNOSIS — Z7984 Long term (current) use of oral hypoglycemic drugs: Secondary | ICD-10-CM

## 2016-01-21 DIAGNOSIS — R918 Other nonspecific abnormal finding of lung field: Secondary | ICD-10-CM

## 2016-01-21 DIAGNOSIS — I447 Left bundle-branch block, unspecified: Secondary | ICD-10-CM | POA: Diagnosis present

## 2016-01-21 DIAGNOSIS — I2102 ST elevation (STEMI) myocardial infarction involving left anterior descending coronary artery: Secondary | ICD-10-CM

## 2016-01-21 DIAGNOSIS — I110 Hypertensive heart disease with heart failure: Secondary | ICD-10-CM | POA: Diagnosis present

## 2016-01-21 DIAGNOSIS — J189 Pneumonia, unspecified organism: Secondary | ICD-10-CM

## 2016-01-21 DIAGNOSIS — R079 Chest pain, unspecified: Secondary | ICD-10-CM

## 2016-01-21 DIAGNOSIS — K567 Ileus, unspecified: Secondary | ICD-10-CM

## 2016-01-21 DIAGNOSIS — J9600 Acute respiratory failure, unspecified whether with hypoxia or hypercapnia: Secondary | ICD-10-CM

## 2016-01-21 DIAGNOSIS — Z881 Allergy status to other antibiotic agents status: Secondary | ICD-10-CM

## 2016-01-21 DIAGNOSIS — Z886 Allergy status to analgesic agent status: Secondary | ICD-10-CM

## 2016-01-21 DIAGNOSIS — G931 Anoxic brain damage, not elsewhere classified: Secondary | ICD-10-CM | POA: Diagnosis not present

## 2016-01-21 DIAGNOSIS — I2510 Atherosclerotic heart disease of native coronary artery without angina pectoris: Secondary | ICD-10-CM

## 2016-01-21 DIAGNOSIS — I272 Other secondary pulmonary hypertension: Secondary | ICD-10-CM | POA: Diagnosis present

## 2016-01-21 DIAGNOSIS — Z7982 Long term (current) use of aspirin: Secondary | ICD-10-CM

## 2016-01-21 DIAGNOSIS — J690 Pneumonitis due to inhalation of food and vomit: Secondary | ICD-10-CM | POA: Diagnosis not present

## 2016-01-21 DIAGNOSIS — Z452 Encounter for adjustment and management of vascular access device: Secondary | ICD-10-CM

## 2016-01-21 DIAGNOSIS — I209 Angina pectoris, unspecified: Secondary | ICD-10-CM

## 2016-01-21 DIAGNOSIS — I472 Ventricular tachycardia: Secondary | ICD-10-CM | POA: Diagnosis present

## 2016-01-21 DIAGNOSIS — Z803 Family history of malignant neoplasm of breast: Secondary | ICD-10-CM

## 2016-01-21 DIAGNOSIS — R570 Cardiogenic shock: Secondary | ICD-10-CM

## 2016-01-21 DIAGNOSIS — Z66 Do not resuscitate: Secondary | ICD-10-CM | POA: Diagnosis not present

## 2016-01-21 DIAGNOSIS — E1159 Type 2 diabetes mellitus with other circulatory complications: Secondary | ICD-10-CM

## 2016-01-21 DIAGNOSIS — I5021 Acute systolic (congestive) heart failure: Secondary | ICD-10-CM | POA: Diagnosis present

## 2016-01-21 DIAGNOSIS — E785 Hyperlipidemia, unspecified: Secondary | ICD-10-CM | POA: Diagnosis present

## 2016-01-21 DIAGNOSIS — K219 Gastro-esophageal reflux disease without esophagitis: Secondary | ICD-10-CM | POA: Diagnosis present

## 2016-01-21 DIAGNOSIS — M170 Bilateral primary osteoarthritis of knee: Secondary | ICD-10-CM | POA: Diagnosis present

## 2016-01-21 DIAGNOSIS — Z95811 Presence of heart assist device: Secondary | ICD-10-CM

## 2016-01-21 DIAGNOSIS — I469 Cardiac arrest, cause unspecified: Secondary | ICD-10-CM

## 2016-01-21 DIAGNOSIS — E876 Hypokalemia: Secondary | ICD-10-CM | POA: Diagnosis present

## 2016-01-21 DIAGNOSIS — I213 ST elevation (STEMI) myocardial infarction of unspecified site: Secondary | ICD-10-CM

## 2016-01-21 HISTORY — PX: CARDIAC CATHETERIZATION: SHX172

## 2016-01-21 HISTORY — DX: Bilateral primary osteoarthritis of knee: M17.0

## 2016-01-21 LAB — BASIC METABOLIC PANEL
Anion gap: 15 (ref 5–15)
BUN: 18 mg/dL (ref 6–20)
CALCIUM: 9.2 mg/dL (ref 8.9–10.3)
CO2: 20 mmol/L — AB (ref 22–32)
CREATININE: 1.28 mg/dL — AB (ref 0.44–1.00)
Chloride: 94 mmol/L — ABNORMAL LOW (ref 101–111)
GFR calc non Af Amer: 40 mL/min — ABNORMAL LOW (ref >60)
GFR, EST AFRICAN AMERICAN: 47 mL/min — AB (ref >60)
Glucose, Bld: 282 mg/dL — ABNORMAL HIGH (ref 65–99)
Potassium: 3.1 mmol/L — ABNORMAL LOW (ref 3.5–5.1)
Sodium: 129 mmol/L — ABNORMAL LOW (ref 135–145)

## 2016-01-21 LAB — I-STAT TROPONIN, ED: TROPONIN I, POC: 0.14 ng/mL — AB (ref 0.00–0.08)

## 2016-01-21 LAB — CBC
HCT: 45.5 % (ref 36.0–46.0)
Hemoglobin: 15.6 g/dL — ABNORMAL HIGH (ref 12.0–15.0)
MCH: 28.2 pg (ref 26.0–34.0)
MCHC: 34.3 g/dL (ref 30.0–36.0)
MCV: 82.1 fL (ref 78.0–100.0)
PLATELETS: 246 10*3/uL (ref 150–400)
RBC: 5.54 MIL/uL — AB (ref 3.87–5.11)
RDW: 12.7 % (ref 11.5–15.5)
WBC: 12.3 10*3/uL — ABNORMAL HIGH (ref 4.0–10.5)

## 2016-01-21 SURGERY — LEFT HEART CATH AND CORONARY ANGIOGRAPHY

## 2016-01-21 MED ORDER — LIDOCAINE HCL (PF) 1 % IJ SOLN
INTRAMUSCULAR | Status: AC
  Filled 2016-01-21: qty 30

## 2016-01-21 MED ORDER — HEPARIN (PORCINE) IN NACL 2-0.9 UNIT/ML-% IJ SOLN
INTRAMUSCULAR | Status: AC
  Filled 2016-01-21: qty 1000

## 2016-01-21 MED ORDER — BIVALIRUDIN BOLUS VIA INFUSION - CUPID
INTRAVENOUS | Status: DC | PRN
  Administered 2016-01-21: 66.375 mg via INTRAVENOUS

## 2016-01-21 MED ORDER — NITROGLYCERIN 1 MG/10 ML FOR IR/CATH LAB
INTRA_ARTERIAL | Status: AC
  Filled 2016-01-21: qty 10

## 2016-01-21 MED ORDER — AMIODARONE HCL 150 MG/3ML IV SOLN
INTRAVENOUS | Status: AC
  Administered 2016-01-22: 300 mg
  Filled 2016-01-21: qty 6

## 2016-01-21 MED ORDER — HEPARIN (PORCINE) IN NACL 100-0.45 UNIT/ML-% IJ SOLN
INTRAMUSCULAR | Status: AC
  Filled 2016-01-21: qty 250

## 2016-01-21 MED ORDER — HEPARIN SODIUM (PORCINE) 1000 UNIT/ML IJ SOLN
INTRAMUSCULAR | Status: AC
  Filled 2016-01-21: qty 1

## 2016-01-21 MED ORDER — ONDANSETRON HCL 4 MG/2ML IJ SOLN
4.0000 mg | Freq: Once | INTRAMUSCULAR | Status: AC
  Administered 2016-01-21: 4 mg via INTRAVENOUS
  Filled 2016-01-21: qty 2

## 2016-01-21 MED ORDER — AMIODARONE HCL IN DEXTROSE 360-4.14 MG/200ML-% IV SOLN
60.0000 mg/h | INTRAVENOUS | Status: DC
Start: 2016-01-21 — End: 2016-01-22
  Filled 2016-01-21: qty 200

## 2016-01-21 MED ORDER — BIVALIRUDIN 250 MG IV SOLR
INTRAVENOUS | Status: AC
  Filled 2016-01-21: qty 250

## 2016-01-21 MED ORDER — MORPHINE SULFATE (PF) 2 MG/ML IV SOLN
2.0000 mg | Freq: Once | INTRAVENOUS | Status: AC
  Administered 2016-01-21: 2 mg via INTRAVENOUS

## 2016-01-21 MED ORDER — HEPARIN SODIUM (PORCINE) 5000 UNIT/ML IJ SOLN
4000.0000 [IU] | Freq: Once | INTRAMUSCULAR | Status: AC
  Administered 2016-01-21: 4000 [IU] via INTRAVENOUS

## 2016-01-21 MED ORDER — AMIODARONE HCL IN DEXTROSE 360-4.14 MG/200ML-% IV SOLN
30.0000 mg/h | INTRAVENOUS | Status: DC
Start: 2016-01-22 — End: 2016-01-22

## 2016-01-21 MED ORDER — AMIODARONE IV BOLUS ONLY 150 MG/100ML
150.0000 mg | Freq: Once | INTRAVENOUS | Status: AC
  Administered 2016-01-21: 150 mg via INTRAVENOUS

## 2016-01-21 MED ORDER — VERAPAMIL HCL 2.5 MG/ML IV SOLN
INTRAVENOUS | Status: AC
  Filled 2016-01-21: qty 2

## 2016-01-21 MED ORDER — ASPIRIN 81 MG PO CHEW
324.0000 mg | CHEWABLE_TABLET | Freq: Once | ORAL | Status: AC
  Administered 2016-01-21: 324 mg via ORAL
  Filled 2016-01-21: qty 4

## 2016-01-21 SURGICAL SUPPLY — 24 items
BALLN EMERGE MR 2.5X15 (BALLOONS) ×3
BALLOON EMERGE MR 2.5X15 (BALLOONS) IMPLANT
CATH EXTRAC PRONTO 5.5F 138CM (CATHETERS) ×2 IMPLANT
CATH INFINITI 5FR ANG PIGTAIL (CATHETERS) ×2 IMPLANT
CATH INFINITI JR4 5F (CATHETERS) ×2 IMPLANT
CATH SWAN GANZ 7F STRAIGHT (CATHETERS) ×2 IMPLANT
DEVICE RAD COMP TR BAND LRG (VASCULAR PRODUCTS) ×2 IMPLANT
GLIDESHEATH SLEND SS 6F .021 (SHEATH) ×2 IMPLANT
GUIDE CATH RUNWAY 6FR CLS3 (CATHETERS) ×2 IMPLANT
KIT ENCORE 26 ADVANTAGE (KITS) ×2 IMPLANT
KIT HEART LEFT (KITS) ×3 IMPLANT
KIT HEART RIGHT NAMIC (KITS) ×2 IMPLANT
PACK CARDIAC CATHETERIZATION (CUSTOM PROCEDURE TRAY) ×3 IMPLANT
SET IMPELLA CP PUMP (CATHETERS) ×2 IMPLANT
SHEATH PINNACLE 6F 10CM (SHEATH) ×2 IMPLANT
SHEATH PINNACLE 7F 10CM (SHEATH) ×2 IMPLANT
SLEEVE REPOSITIONING LENGTH 30 (MISCELLANEOUS) ×2 IMPLANT
STENT PROMUS PREM MR 2.5X38 (Permanent Stent) ×2 IMPLANT
TRANSDUCER W/STOPCOCK (MISCELLANEOUS) ×3 IMPLANT
TUBING CIL FLEX 10 FLL-RA (TUBING) ×3 IMPLANT
VALVE GUARDIAN II ~~LOC~~ HEMO (MISCELLANEOUS) ×2 IMPLANT
WIRE ASAHI PROWATER 180CM (WIRE) ×2 IMPLANT
WIRE HI TORQ BMW 190CM (WIRE) ×2 IMPLANT
WIRE SAFE-T 1.5MM-J .035X260CM (WIRE) ×2 IMPLANT

## 2016-01-21 NOTE — H&P (Addendum)
History and Physical   Admit date: 02/09/2016 Name:  Tracy Mckee Medical record number: [id number] DOB/Age:  November 29, 1940  75 y.o. female  Referring Physician:   [facility]  Primary Physician:  Dr. Pearson Grippe  Chief complaint/reason for admission:  Chest pain  HPI:  This 75 year old female has a history of hypertension and diabetes and hyperlipidemia.  She had arthroscopic knee surgery on July 26.  She developed significant substernal chest pain around 9 PM tonight and presented to [facility] emergency room where she was found to have what was felt to be an acute anterior infarction.  She had some ventricular tachycardia in the emergency room was started on intravenous amiodarone.  She has continued to have chest discomfort and was brought by EMS directly to the cath lab.  She is continuing to have chest pain and shortness of breath as well as diaphoresis at the time of presentation.   Past Medical History:  Diagnosis Date  . Diabetes mellitus without complication (HCC)   . Diverticulitis   . Edema   . GERD (gastroesophageal reflux disease)   . Hyperlipidemia   . Hypertension   . Osteoarthritis of both knees      Past Surgical History:  Procedure Laterality Date  . ESOPHAGEAL DILATION    . KNEE ARTHROSCOPY WITH MEDIAL MENISECTOMY Right 01/10/2016   Procedure: KNEE ARTHROSCOPY WITH MEDIAL  AND LATERAL MENISECTOMY;  Surgeon: Vickki Hearing, MD;  Location: [facility];  Service: Orthopedics;  Laterality: Right;  . OVARIAN CYST REMOVAL     Allergies: is allergic to baclofen; ciprofloxacin; hydrocodone-acetaminophen; and other.   Medications: Prior to Admission medications   Medication Sig Start Date End Date Taking? Authorizing Provider  aspirin EC 81 MG tablet Take 81 mg by mouth daily.    Historical Provider, MD  CINNAMON PO Take 1 g by mouth 2 (two) times daily.    Historical Provider, MD  ciprofloxacin (CIPRO) 500 MG tablet Take 1 tablet (500 mg total) by mouth 2  (two) times daily. 01/16/16   Vickki Hearing, MD  fluticasone (FLONASE) 50 MCG/ACT nasal spray Place into both nostrils daily.    Historical Provider, MD  glimepiride (AMARYL) 2 MG tablet Take 2 mg by mouth daily with breakfast.    Historical Provider, MD  metFORMIN (GLUCOPHAGE-XR) 500 MG 24 hr tablet Take 500 mg by mouth 2 (two) times daily.    Historical Provider, MD  metroNIDAZOLE (FLAGYL) 500 MG tablet Take 1 tablet (500 mg total) by mouth 3 (three) times daily. 01/16/16   Vickki Hearing, MD  Omega-3 Fatty Acids (FISH OIL) 1000 MG CAPS Take 1 capsule by mouth daily.    Historical Provider, MD  omeprazole (PRILOSEC) 40 MG capsule Take 40 mg by mouth daily.    Historical Provider, MD  traMADol (ULTRAM) 50 MG tablet Take 1 tablet (50 mg total) by mouth every 6 (six) hours as needed. Patient taking differently: Take 50 mg by mouth every 6 (six) hours as needed for moderate pain.  11/22/15   Darreld Mclean, MD  triamterene-hydrochlorothiazide (MAXZIDE-25) 37.5-25 MG per tablet Take 1 tablet by mouth daily.    Historical Provider, MD   Family History:  Family Status  Relation Status  . Mother Deceased  . Father Deceased  . Sister Deceased  . Brother Alive    Social History:   reports that she has never smoked. She has never used smokeless tobacco. She reports that she drinks alcohol. She reports that she does  not use drugs.    Review of Systems: No history of significant bleeding Other than as noted above, the remainder of the review of systems difficult to obtain due to the urgent nature of the procedure.  Physical Exam: BP 138/87   Pulse 107   Resp (!) 36   SpO2 (!) 88%  General appearance: Elderly diaphoretic white female complaining of chest pain Head: Normocephalic, without obvious abnormality, atraumatic Neck: no adenopathy, supple, symmetrical, trachea midline and JVD difficult to assess on table Lungs: clear to auscultation bilaterally Heart: regular rate and rhythm, S1, S2  normal, no murmur, click, rub or gallop Abdomen: soft, non-tender; bowel sounds normal; no masses,  no organomegaly Pelvic: deferred Extremities: Extremities cool, pulses are present. Pulses: pedal pulses are 1+ Skin: Skin cool somewhat pale, clammy Neurologic: Grossly normal  Labs: CBC  Recent Labs  June 26, 2015 2232  WBC 12.3*  RBC 5.54*  HGB 15.6*  HCT 45.5  PLT 246  MCV 82.1  MCH 28.2  MCHC 34.3  RDW 12.7   CMP   Recent Labs  June 26, 2015 2232  NA 129*  K 3.1*  CL 94*  CO2 20*  GLUCOSE 282*  BUN 18  CREATININE 1.28*  CALCIUM 9.2  GFRNONAA 40*  GFRAA 47*   Cardiac Panel (last 3 results) Troponin (Point of Care Test)  Recent Labs  June 26, 2015 2237  TROPIPOC 0.14*      IMPRESSIONS: 1.  Acute anterior myocardial infarction 2.  Diabetes mellitus 3.  Hypertension 4.  Hyperlipidemia 5.  Recent knee surgery 6.  Hypokalemia  PLAN: Patient was brought to the cath lab for acute intervention.  She had somewhat low blood pressure on presentation.  Signed: Darden PalmerW. Spencer Tilley, Jr. MD [facility]  02/07/2016, 11:57 PM  I have examined the patient and reviewed assessment and plan and discussed with patient.  Agree with above as stated.  Patient with acute anterior MI.  I personally reviewed the ECG and spoke with the ER MD at [facility] and made the decision for her to come to the cath lab. EKG:  ST elevation in V2, widened QRS compared to prior ECG in an incomplete LBBB pattern- markedly different from ECG in July 2017.  Upon arrival, she was diaphoretic.  At the start of the cath procedure, her pulses were diminished and intial hemodynamic eval showed systolic BP in the high 70s.  THis improved initially with reperfusion but then dropped again which prompted Impella hemodynamic support and right heart cath.  Please see cath note for details.    Lance MussJayadeep Farryn Linares

## 2016-01-21 NOTE — ED Notes (Signed)
CRITICAL VALUE ALERT  Critical value received:  Trop 0.14  Date of notification:  02/03/2016  Time of notification:  2240  Critical value read back:No.  Nurse who received alert:  B. Reuel Boomaniel, RN  MD notified (1st page):  Ranae PalmsYelverton  Time of first page:  [phone]

## 2016-01-21 NOTE — ED Triage Notes (Signed)
Chest pain started at 2100. Nausea and vomiting, diaphoretic, and pain radiating to left arm and back. Recent knee surgery

## 2016-01-21 NOTE — ED Provider Notes (Signed)
[facility] Provider Note   CSN: [id number] Arrival date & time: January 29, 2016  2219  First Provider Contact:  None       History   Chief Complaint Chief Complaint  Patient presents with  . Code STEMI    HPI Tracy Mckee is a 74 y.o. female.  HPI Patient presents with acute onset left-sided chest pain radiating to the left arm starting at 9 PM this evening. Associated with nausea and several episodes of vomiting. Denies any shortness of breath, lower extremity swelling. Denies recent cough, fever or chills. No previous cardiac history. Recent arthroscopic meniscal repair the right knee. Patient states she's had minimal pain in the right leg since the surgery. Past Medical History:  Diagnosis Date  . Diabetes mellitus without complication (HCC)   . Diverticulitis   . Edema   . GERD (gastroesophageal reflux disease)   . Hyperlipidemia   . Hypertension   . Knee pain     Patient Active Problem List   Diagnosis Date Noted  . Medial meniscus, posterior horn derangement   . Meniscus, lateral, derangement     Past Surgical History:  Procedure Laterality Date  . ESOPHAGEAL DILATION    . KNEE ARTHROSCOPY WITH MEDIAL MENISECTOMY Right 01/10/2016   Procedure: KNEE ARTHROSCOPY WITH MEDIAL  AND LATERAL MENISECTOMY;  Surgeon: Vickki Hearing, MD;  Location: [facility];  Service: Orthopedics;  Laterality: Right;  . OVARIAN CYST REMOVAL      OB History    No data available       Home Medications    Prior to Admission medications   Medication Sig Start Date End Date Taking? Authorizing Provider  aspirin EC 81 MG tablet Take 81 mg by mouth daily.    Historical Provider, MD  CINNAMON PO Take 1 g by mouth 2 (two) times daily.    Historical Provider, MD  ciprofloxacin (CIPRO) 500 MG tablet Take 1 tablet (500 mg total) by mouth 2 (two) times daily. 01/16/16   Vickki Hearing, MD  fluticasone (FLONASE) 50 MCG/ACT nasal spray Place into both nostrils daily.    Historical  Provider, MD  glimepiride (AMARYL) 2 MG tablet Take 2 mg by mouth daily with breakfast.    Historical Provider, MD  metFORMIN (GLUCOPHAGE-XR) 500 MG 24 hr tablet Take 500 mg by mouth 2 (two) times daily.    Historical Provider, MD  metroNIDAZOLE (FLAGYL) 500 MG tablet Take 1 tablet (500 mg total) by mouth 3 (three) times daily. 01/16/16   Vickki Hearing, MD  Omega-3 Fatty Acids (FISH OIL) 1000 MG CAPS Take 1 capsule by mouth daily.    Historical Provider, MD  omeprazole (PRILOSEC) 40 MG capsule Take 40 mg by mouth daily.    Historical Provider, MD  traMADol (ULTRAM) 50 MG tablet Take 1 tablet (50 mg total) by mouth every 6 (six) hours as needed. Patient taking differently: Take 50 mg by mouth every 6 (six) hours as needed for moderate pain.  11/22/15   Darreld Mclean, MD  triamterene-hydrochlorothiazide (MAXZIDE-25) 37.5-25 MG per tablet Take 1 tablet by mouth daily.    Historical Provider, MD    Family History History reviewed. No pertinent family history.  Social History Social History  Substance Use Topics  . Smoking status: Never Smoker  . Smokeless tobacco: Never Used  . Alcohol use Yes     Comment: occassionally     Allergies   Baclofen; Ciprofloxacin; Hydrocodone-acetaminophen; and Other   Review of Systems Review of Systems  Constitutional:  Positive for diaphoresis. Negative for fever.  Respiratory: Negative for cough and shortness of breath.   Cardiovascular: Positive for chest pain. Negative for palpitations and leg swelling.  Gastrointestinal: Positive for nausea and vomiting. Negative for abdominal pain.  Musculoskeletal: Negative for back pain and neck pain.  Skin: Negative for rash and wound.  Neurological: Positive for light-headedness.  All other systems reviewed and are negative.    Physical Exam Updated Vital Signs BP 138/87   Pulse 107   Resp (!) 36   SpO2 95%   Physical Exam  Constitutional: She is oriented to person, place, and time. She appears  well-developed and well-nourished. She appears distressed.  HENT:  Head: Normocephalic and atraumatic.  Mouth/Throat: Oropharynx is clear and moist.  Eyes: EOM are normal. Pupils are equal, round, and reactive to light.  Neck: Normal range of motion. Neck supple. No JVD present.  Cardiovascular: Regular rhythm.   Tachycardia  Pulmonary/Chest: Effort normal and breath sounds normal.  Abdominal: Soft. Bowel sounds are normal. There is no tenderness. There is no rebound and no guarding.  Musculoskeletal: Normal range of motion. She exhibits no edema or tenderness.  Right knee with mild contusion and well-healing surgical scars. No calf asymmetry or tenderness. Distal pulses equal and 2+.  Neurological: She is alert and oriented to person, place, and time.  Skin: Skin is warm. Capillary refill takes less than 2 seconds. No rash noted. She is diaphoretic. No erythema. There is pallor.  Psychiatric:  Anxious appearing  Nursing note and vitals reviewed.    ED Treatments / Results  Labs (all labs ordered are listed, but only abnormal results are displayed) Labs Reviewed  BASIC METABOLIC PANEL - Abnormal; Notable for the following:       Result Value   Sodium 129 (*)    Potassium 3.1 (*)    Chloride 94 (*)    CO2 20 (*)    Glucose, Bld 282 (*)    Creatinine, Ser 1.28 (*)    GFR calc non Af Amer 40 (*)    GFR calc Af Amer 47 (*)    All other components within normal limits  CBC - Abnormal; Notable for the following:    WBC 12.3 (*)    RBC 5.54 (*)    Hemoglobin 15.6 (*)    All other components within normal limits  I-STAT TROPOININ, ED - Abnormal; Notable for the following:    Troponin i, poc 0.14 (*)    All other components within normal limits    EKG  EKG Interpretation  Date/Time:  Sunday January 21 2016 22:21:57 EDT Ventricular Rate:  108 PR Interval:  176 QRS Duration: 110 QT Interval:  354 QTC Calculation: 474 R Axis:   -68 Text Interpretation:  Undetermined rhythm  Left axis deviation Incomplete right bundle branch block Minimal voltage criteria for LVH, may be normal variant ST elevation, consider early repolarization, pericarditis, or injury Abnormal ECG Confirmed by Ranae PalmsYELVERTON  MD, Leslie Langille ([id number]) on 02/09/2016 10:27:56 PM       Radiology No results found.  Procedures Procedures (including critical care time)  Medications Ordered in ED Medications  morphine 2 MG/ML injection 2 mg (not administered)  heparin 100-0.45 UNIT/ML-% infusion (not administered)  amiodarone (CORDARONE) 150 MG/3ML injection (not administered)  amiodarone (NEXTERONE PREMIX) 360-4.14 MG/200ML-% (1.8 mg/mL) IV infusion (not administered)  amiodarone (NEXTERONE PREMIX) 360-4.14 MG/200ML-% (1.8 mg/mL) IV infusion (not administered)  aspirin chewable tablet 324 mg (324 mg Oral Given 02/14/2016 2302)  ondansetron (ZOFRAN) injection  4 mg (4 mg Intravenous Given 12-Feb-2016 2306)  heparin injection 4,000 Units (4,000 Units Intravenous Given 02-12-16 2307)  amiodarone (NEXTERONE) IV bolus only 150 mg/100 mL (0 mg Intravenous Stopped 2016-02-12 2309)     Initial Impression / Assessment and Plan / ED Course  I have reviewed the triage vital signs and the nursing notes.  Pertinent labs & imaging results that were available during my care of the patient were reviewed by me and considered in my medical decision making (see chart for details).  Clinical Course    Patient with what appears to be a interventricular conduction deficit on EKG. This is new compared to EKG from 7/17. There is also elevation in V2 without obvious reciprocal changes. Discussed with Dr.Varanassi who reviewed the patient's current and previous EKG. Agreed to call code STEMI. Advised aspirin and heparin bolus. Patient had a run of ventricular tachycardia while in the emergency department. She did not lose consciousness. She was given 150 mg of amiodarone bolus and then amiodarone infusion was initiated. Dr. Isabel Caprice is aware.  Defibrillation pads were placed. Also spoke with Dr. Preston Fleeting in the emergency department at [facility]. He is anticipating patient arrival.  Final Clinical Impressions(s) / ED Diagnoses   Final diagnoses:  Acute ST elevation myocardial infarction (STEMI), unspecified artery (HCC)   CRITICAL CARE Performed by: Ranae Palms, Diamonds Lippard Total critical care time: 45 minutes Critical care time was exclusive of separately billable procedures and treating other patients. Critical care was necessary to treat or prevent imminent or life-threatening deterioration. Critical care was time spent personally by me on the following activities: development of treatment plan with patient and/or surrogate as well as nursing, discussions with consultants, evaluation of patient's response to treatment, examination of patient, obtaining history from patient or surrogate, ordering and performing treatments and interventions, ordering and review of laboratory studies, ordering and review of radiographic studies, pulse oximetry and re-evaluation of patient's condition.  New Prescriptions New Prescriptions   No medications on file     Loren Racer, MD February 12, 2016 2324

## 2016-01-22 ENCOUNTER — Inpatient Hospital Stay (HOSPITAL_COMMUNITY): Payer: PPO

## 2016-01-22 ENCOUNTER — Emergency Department (HOSPITAL_COMMUNITY): Payer: PPO | Admitting: Anesthesiology

## 2016-01-22 ENCOUNTER — Encounter: Payer: Self-pay | Admitting: Cardiology

## 2016-01-22 ENCOUNTER — Other Ambulatory Visit (HOSPITAL_COMMUNITY): Payer: PPO

## 2016-01-22 DIAGNOSIS — J9600 Acute respiratory failure, unspecified whether with hypoxia or hypercapnia: Secondary | ICD-10-CM | POA: Diagnosis not present

## 2016-01-22 DIAGNOSIS — E785 Hyperlipidemia, unspecified: Secondary | ICD-10-CM | POA: Diagnosis present

## 2016-01-22 DIAGNOSIS — I110 Hypertensive heart disease with heart failure: Secondary | ICD-10-CM | POA: Diagnosis present

## 2016-01-22 DIAGNOSIS — R072 Precordial pain: Secondary | ICD-10-CM | POA: Insufficient documentation

## 2016-01-22 DIAGNOSIS — R079 Chest pain, unspecified: Secondary | ICD-10-CM | POA: Diagnosis present

## 2016-01-22 DIAGNOSIS — J80 Acute respiratory distress syndrome: Secondary | ICD-10-CM | POA: Diagnosis not present

## 2016-01-22 DIAGNOSIS — Z95811 Presence of heart assist device: Secondary | ICD-10-CM | POA: Diagnosis not present

## 2016-01-22 DIAGNOSIS — Z7984 Long term (current) use of oral hypoglycemic drugs: Secondary | ICD-10-CM | POA: Diagnosis not present

## 2016-01-22 DIAGNOSIS — I2510 Atherosclerotic heart disease of native coronary artery without angina pectoris: Secondary | ICD-10-CM | POA: Insufficient documentation

## 2016-01-22 DIAGNOSIS — I10 Essential (primary) hypertension: Secondary | ICD-10-CM | POA: Insufficient documentation

## 2016-01-22 DIAGNOSIS — Z886 Allergy status to analgesic agent status: Secondary | ICD-10-CM | POA: Diagnosis not present

## 2016-01-22 DIAGNOSIS — J9601 Acute respiratory failure with hypoxia: Secondary | ICD-10-CM

## 2016-01-22 DIAGNOSIS — N179 Acute kidney failure, unspecified: Secondary | ICD-10-CM

## 2016-01-22 DIAGNOSIS — I2609 Other pulmonary embolism with acute cor pulmonale: Secondary | ICD-10-CM

## 2016-01-22 DIAGNOSIS — J690 Pneumonitis due to inhalation of food and vomit: Secondary | ICD-10-CM | POA: Diagnosis not present

## 2016-01-22 DIAGNOSIS — M170 Bilateral primary osteoarthritis of knee: Secondary | ICD-10-CM | POA: Diagnosis present

## 2016-01-22 DIAGNOSIS — I2109 ST elevation (STEMI) myocardial infarction involving other coronary artery of anterior wall: Secondary | ICD-10-CM | POA: Diagnosis present

## 2016-01-22 DIAGNOSIS — E119 Type 2 diabetes mellitus without complications: Secondary | ICD-10-CM | POA: Insufficient documentation

## 2016-01-22 DIAGNOSIS — Z888 Allergy status to other drugs, medicaments and biological substances status: Secondary | ICD-10-CM | POA: Diagnosis not present

## 2016-01-22 DIAGNOSIS — I5021 Acute systolic (congestive) heart failure: Secondary | ICD-10-CM | POA: Diagnosis present

## 2016-01-22 DIAGNOSIS — Z881 Allergy status to other antibiotic agents status: Secondary | ICD-10-CM | POA: Diagnosis not present

## 2016-01-22 DIAGNOSIS — G931 Anoxic brain damage, not elsewhere classified: Secondary | ICD-10-CM | POA: Diagnosis not present

## 2016-01-22 DIAGNOSIS — I469 Cardiac arrest, cause unspecified: Secondary | ICD-10-CM

## 2016-01-22 DIAGNOSIS — E1165 Type 2 diabetes mellitus with hyperglycemia: Secondary | ICD-10-CM | POA: Diagnosis present

## 2016-01-22 DIAGNOSIS — Z66 Do not resuscitate: Secondary | ICD-10-CM | POA: Diagnosis not present

## 2016-01-22 DIAGNOSIS — I213 ST elevation (STEMI) myocardial infarction of unspecified site: Secondary | ICD-10-CM | POA: Diagnosis not present

## 2016-01-22 DIAGNOSIS — R570 Cardiogenic shock: Secondary | ICD-10-CM | POA: Diagnosis not present

## 2016-01-22 DIAGNOSIS — Z7982 Long term (current) use of aspirin: Secondary | ICD-10-CM | POA: Diagnosis not present

## 2016-01-22 DIAGNOSIS — R34 Anuria and oliguria: Secondary | ICD-10-CM | POA: Diagnosis not present

## 2016-01-22 DIAGNOSIS — E872 Acidosis: Secondary | ICD-10-CM | POA: Diagnosis present

## 2016-01-22 DIAGNOSIS — I349 Nonrheumatic mitral valve disorder, unspecified: Secondary | ICD-10-CM | POA: Diagnosis not present

## 2016-01-22 DIAGNOSIS — E876 Hypokalemia: Secondary | ICD-10-CM | POA: Diagnosis present

## 2016-01-22 DIAGNOSIS — I209 Angina pectoris, unspecified: Secondary | ICD-10-CM | POA: Diagnosis not present

## 2016-01-22 DIAGNOSIS — K219 Gastro-esophageal reflux disease without esophagitis: Secondary | ICD-10-CM | POA: Insufficient documentation

## 2016-01-22 DIAGNOSIS — D689 Coagulation defect, unspecified: Secondary | ICD-10-CM | POA: Diagnosis present

## 2016-01-22 DIAGNOSIS — R918 Other nonspecific abnormal finding of lung field: Secondary | ICD-10-CM

## 2016-01-22 DIAGNOSIS — I472 Ventricular tachycardia: Secondary | ICD-10-CM | POA: Diagnosis present

## 2016-01-22 LAB — CBC
HCT: 41.1 % (ref 36.0–46.0)
Hemoglobin: 13.3 g/dL (ref 12.0–15.0)
MCH: 28.2 pg (ref 26.0–34.0)
MCHC: 32.4 g/dL (ref 30.0–36.0)
MCV: 87.1 fL (ref 78.0–100.0)
Platelets: 339 10*3/uL (ref 150–400)
RBC: 4.72 MIL/uL (ref 3.87–5.11)
RDW: 13 % (ref 11.5–15.5)
WBC: 27.5 10*3/uL — ABNORMAL HIGH (ref 4.0–10.5)

## 2016-01-22 LAB — POCT I-STAT, CHEM 8
BUN: 23 mg/dL — AB (ref 6–20)
BUN: 27 mg/dL — AB (ref 6–20)
BUN: 28 mg/dL — ABNORMAL HIGH (ref 6–20)
CALCIUM ION: 0.99 mmol/L — AB (ref 1.12–1.23)
CHLORIDE: 101 mmol/L (ref 101–111)
CHLORIDE: 96 mmol/L — AB (ref 101–111)
CREATININE: 1.5 mg/dL — AB (ref 0.44–1.00)
CREATININE: 1.4 mg/dL — AB (ref 0.44–1.00)
Calcium, Ion: 0.97 mmol/L — ABNORMAL LOW (ref 1.12–1.23)
Calcium, Ion: 0.87 mmol/L — ABNORMAL LOW (ref 1.12–1.23)
Chloride: 101 mmol/L (ref 101–111)
Creatinine, Ser: 1.5 mg/dL — ABNORMAL HIGH (ref 0.44–1.00)
Glucose, Bld: 646 mg/dL (ref 65–99)
Glucose, Bld: 654 mg/dL (ref 65–99)
Glucose, Bld: >700 mg/dL (ref 65–99)
HCT: 43 % (ref 36.0–46.0)
HCT: 39 % (ref 36.0–46.0)
HEMATOCRIT: 40 % (ref 36.0–46.0)
HEMOGLOBIN: 13.3 g/dL (ref 12.0–15.0)
Hemoglobin: 13.6 g/dL (ref 12.0–15.0)
Hemoglobin: 14.6 g/dL (ref 12.0–15.0)
POTASSIUM: 2.7 mmol/L — AB (ref 3.5–5.1)
Potassium: 3.3 mmol/L — ABNORMAL LOW (ref 3.5–5.1)
Potassium: 3.3 mmol/L — ABNORMAL LOW (ref 3.5–5.1)
SODIUM: 140 mmol/L (ref 135–145)
SODIUM: 142 mmol/L (ref 135–145)
Sodium: 140 mmol/L (ref 135–145)
TCO2: 17 mmol/L (ref 0–100)
TCO2: 14 mmol/L (ref 0–100)
TCO2: 18 mmol/L (ref 0–100)

## 2016-01-22 LAB — BLOOD GAS, ARTERIAL
ACID-BASE DEFICIT: 6.6 mmol/L — AB (ref 0.0–2.0)
Acid-base deficit: 11.1 mmol/L — ABNORMAL HIGH (ref 0.0–2.0)
Acid-base deficit: 8.6 mmol/L — ABNORMAL HIGH (ref 0.0–2.0)
BICARBONATE: 15.7 meq/L — AB (ref 20.0–24.0)
BICARBONATE: 17.4 meq/L — AB (ref 20.0–24.0)
BICARBONATE: 19.4 meq/L — AB (ref 20.0–24.0)
DRAWN BY: 462031
Drawn by: 441371
Drawn by: 462031
FIO2: 100
FIO2: 1
FIO2: 1
LHR: 35 {breaths}/min
MECHVT: 500 mL
O2 SAT: 96.9 %
O2 SAT: 97.8 %
O2 Saturation: 96.2 %
PATIENT TEMPERATURE: 97.5
PATIENT TEMPERATURE: 98.6
PCO2 ART: 41.5 mmHg (ref 35.0–45.0)
PEEP/CPAP: 18 cmH2O
PEEP/CPAP: 18 cmH2O
PEEP: 18 cmH2O
PH ART: 7.245 — AB (ref 7.350–7.450)
PO2 ART: 89.9 mmHg (ref 80.0–100.0)
Patient temperature: 98.6
RATE: 35 resp/min
RATE: 35 resp/min
TCO2: 17 mmol/L (ref 0–100)
TCO2: 18.7 mmol/L (ref 0–100)
TCO2: 20.8 mmol/L (ref 0–100)
VT: 500 mL
VT: 500 mL
pCO2 arterial: 42.3 mmHg (ref 35.0–45.0)
pCO2 arterial: 45.9 mmHg — ABNORMAL HIGH (ref 35.0–45.0)
pH, Arterial: 7.19 — CL (ref 7.350–7.450)
pH, Arterial: 7.248 — ABNORMAL LOW (ref 7.350–7.450)
pO2, Arterial: 94.1 mmHg (ref 80.0–100.0)
pO2, Arterial: 108 mmHg — ABNORMAL HIGH (ref 80.0–100.0)

## 2016-01-22 LAB — PHOSPHORUS: Phosphorus: 8.9 mg/dL — ABNORMAL HIGH (ref 2.5–4.6)

## 2016-01-22 LAB — GLUCOSE, CAPILLARY
Glucose-Capillary: >600 mg/dL (ref 65–99)
Glucose-Capillary: >600 mg/dL (ref 65–99)
Glucose-Capillary: >600 mg/dL (ref 65–99)
Glucose-Capillary: >600 mg/dL (ref 65–99)
Glucose-Capillary: >600 mg/dL (ref 65–99)
Glucose-Capillary: >600 mg/dL (ref 65–99)
Glucose-Capillary: >600 mg/dL (ref 65–99)
Glucose-Capillary: >600 mg/dL (ref 65–99)

## 2016-01-22 LAB — BASIC METABOLIC PANEL
ANION GAP: 24 — AB (ref 5–15)
ANION GAP: 27 — AB (ref 5–15)
ANION GAP: 25 — AB (ref 5–15)
ANION GAP: 22 — AB (ref 5–15)
ANION GAP: 23 — AB (ref 5–15)
Anion gap: 24 — ABNORMAL HIGH (ref 5–15)
BUN: 17 mg/dL (ref 6–20)
BUN: 19 mg/dL (ref 6–20)
BUN: 21 mg/dL — ABNORMAL HIGH (ref 6–20)
BUN: 23 mg/dL — ABNORMAL HIGH (ref 6–20)
BUN: 22 mg/dL — ABNORMAL HIGH (ref 6–20)
BUN: 23 mg/dL — ABNORMAL HIGH (ref 6–20)
CALCIUM: 7.2 mg/dL — AB (ref 8.9–10.3)
CALCIUM: 6.7 mg/dL — AB (ref 8.9–10.3)
CALCIUM: 6.3 mg/dL — AB (ref 8.9–10.3)
CALCIUM: 7 mg/dL — AB (ref 8.9–10.3)
CO2: 13 mmol/L — ABNORMAL LOW (ref 22–32)
CO2: 12 mmol/L — ABNORMAL LOW (ref 22–32)
CO2: 16 mmol/L — ABNORMAL LOW (ref 22–32)
CO2: 16 mmol/L — AB (ref 22–32)
CO2: 19 mmol/L — AB (ref 22–32)
CO2: 18 mmol/L — ABNORMAL LOW (ref 22–32)
CREATININE: 1.76 mg/dL — AB (ref 0.44–1.00)
Calcium: 7.2 mg/dL — ABNORMAL LOW (ref 8.9–10.3)
Calcium: 6.7 mg/dL — ABNORMAL LOW (ref 8.9–10.3)
Chloride: 101 mmol/L (ref 101–111)
Chloride: 101 mmol/L (ref 101–111)
Chloride: 99 mmol/L — ABNORMAL LOW (ref 101–111)
Chloride: 97 mmol/L — ABNORMAL LOW (ref 101–111)
Chloride: 98 mmol/L — ABNORMAL LOW (ref 101–111)
Chloride: 95 mmol/L — ABNORMAL LOW (ref 101–111)
Creatinine, Ser: 1.79 mg/dL — ABNORMAL HIGH (ref 0.44–1.00)
Creatinine, Ser: 2.17 mg/dL — ABNORMAL HIGH (ref 0.44–1.00)
Creatinine, Ser: 2.28 mg/dL — ABNORMAL HIGH (ref 0.44–1.00)
Creatinine, Ser: 2.35 mg/dL — ABNORMAL HIGH (ref 0.44–1.00)
Creatinine, Ser: 2.53 mg/dL — ABNORMAL HIGH (ref 0.44–1.00)
GFR calc Af Amer: 31 mL/min — ABNORMAL LOW (ref >60)
GFR calc Af Amer: 25 mL/min — ABNORMAL LOW (ref >60)
GFR calc non Af Amer: 27 mL/min — ABNORMAL LOW (ref >60)
GFR calc non Af Amer: 27 mL/min — ABNORMAL LOW (ref >60)
GFR, EST AFRICAN AMERICAN: 32 mL/min — AB (ref >60)
GFR, EST AFRICAN AMERICAN: 23 mL/min — AB (ref >60)
GFR, EST AFRICAN AMERICAN: 22 mL/min — AB (ref >60)
GFR, EST AFRICAN AMERICAN: 20 mL/min — AB (ref >60)
GFR, EST NON AFRICAN AMERICAN: 21 mL/min — AB (ref >60)
GFR, EST NON AFRICAN AMERICAN: 20 mL/min — AB (ref >60)
GFR, EST NON AFRICAN AMERICAN: 19 mL/min — AB (ref >60)
GFR, EST NON AFRICAN AMERICAN: 18 mL/min — AB (ref >60)
Glucose, Bld: 693 mg/dL (ref 65–99)
Glucose, Bld: 708 mg/dL (ref 65–99)
Glucose, Bld: 755 mg/dL (ref 65–99)
Glucose, Bld: 781 mg/dL (ref 65–99)
Glucose, Bld: 740 mg/dL (ref 65–99)
Glucose, Bld: 675 mg/dL (ref 65–99)
POTASSIUM: 2.5 mmol/L — AB (ref 3.5–5.1)
POTASSIUM: 2.3 mmol/L — AB (ref 3.5–5.1)
Potassium: 3.3 mmol/L — ABNORMAL LOW (ref 3.5–5.1)
Potassium: 3.2 mmol/L — ABNORMAL LOW (ref 3.5–5.1)
Potassium: 2 mmol/L — CL (ref 3.5–5.1)
Potassium: 2.3 mmol/L — CL (ref 3.5–5.1)
SODIUM: 138 mmol/L (ref 135–145)
SODIUM: 142 mmol/L (ref 135–145)
Sodium: 137 mmol/L (ref 135–145)
Sodium: 138 mmol/L (ref 135–145)
Sodium: 139 mmol/L (ref 135–145)
Sodium: 136 mmol/L (ref 135–145)

## 2016-01-22 LAB — POCT I-STAT 3, VENOUS BLOOD GAS (G3P V)
ACID-BASE DEFICIT: 18 mmol/L — AB (ref 0.0–2.0)
Bicarbonate: 14 mEq/L — ABNORMAL LOW (ref 20.0–24.0)
O2 SAT: 23 %
PO2 VEN: 26 mmHg — AB (ref 31.0–45.0)
TCO2: 16 mmol/L (ref 0–100)
pCO2, Ven: 63.5 mmHg — ABNORMAL HIGH (ref 45.0–50.0)
pH, Ven: 6.953 — CL (ref 7.250–7.300)

## 2016-01-22 LAB — URINALYSIS, ROUTINE W REFLEX MICROSCOPIC
BILIRUBIN URINE: NEGATIVE
Glucose, UA: 500 mg/dL — AB
Ketones, ur: NEGATIVE mg/dL
Leukocytes, UA: NEGATIVE
Nitrite: NEGATIVE
PH: 5 (ref 5.0–8.0)
Protein, ur: 100 mg/dL — AB
SPECIFIC GRAVITY, URINE: 1.031 — AB (ref 1.005–1.030)

## 2016-01-22 LAB — POCT I-STAT 3, ART BLOOD GAS (G3+)
ACID-BASE DEFICIT: 25 mmol/L — AB (ref 0.0–2.0)
ACID-BASE EXCESS: 3 mmol/L — AB (ref 0.0–2.0)
Acid-base deficit: 21 mmol/L — ABNORMAL HIGH (ref 0.0–2.0)
Acid-base deficit: 19 mmol/L — ABNORMAL HIGH (ref 0.0–2.0)
Acid-base deficit: 11 mmol/L — ABNORMAL HIGH (ref 0.0–2.0)
Acid-base deficit: 13 mmol/L — ABNORMAL HIGH (ref 0.0–2.0)
Acid-base deficit: 12 mmol/L — ABNORMAL HIGH (ref 0.0–2.0)
BICARBONATE: 10.6 meq/L — AB (ref 20.0–24.0)
BICARBONATE: 15.8 meq/L — AB (ref 20.0–24.0)
Bicarbonate: 32.8 mEq/L — ABNORMAL HIGH (ref 20.0–24.0)
Bicarbonate: 8.2 mEq/L — ABNORMAL LOW (ref 20.0–24.0)
Bicarbonate: 10.3 mEq/L — ABNORMAL LOW (ref 20.0–24.0)
Bicarbonate: 14.9 mEq/L — ABNORMAL LOW (ref 20.0–24.0)
Bicarbonate: 16.2 mEq/L — ABNORMAL LOW (ref 20.0–24.0)
O2 SAT: 67 %
O2 SAT: 75 %
O2 SAT: 73 %
O2 SAT: 92 %
O2 SAT: 99 %
O2 Saturation: 67 %
O2 Saturation: 92 %
PCO2 ART: 44.3 mmHg (ref 35.0–45.0)
PCO2 ART: 39.5 mmHg (ref 35.0–45.0)
PCO2 ART: 34.4 mmHg — AB (ref 35.0–45.0)
PH ART: 7.192 — AB (ref 7.350–7.450)
PH ART: 6.874 — AB (ref 7.350–7.450)
PH ART: 7.039 — AB (ref 7.350–7.450)
PH ART: 7.242 — AB (ref 7.350–7.450)
PO2 ART: 55 mmHg — AB (ref 80.0–100.0)
PO2 ART: 138 mmHg — AB (ref 80.0–100.0)
PO2 ART: 60 mmHg — AB (ref 80.0–100.0)
Patient temperature: 34.7
Patient temperature: 32.7
Patient temperature: 33.4
TCO2: 35 mmol/L (ref 0–100)
TCO2: 9 mmol/L (ref 0–100)
TCO2: 12 mmol/L (ref 0–100)
TCO2: 12 mmol/L (ref 0–100)
TCO2: 16 mmol/L (ref 0–100)
TCO2: 18 mmol/L (ref 0–100)
TCO2: 17 mmol/L (ref 0–100)
pCO2 arterial: 85.4 mmHg (ref 35.0–45.0)
pCO2 arterial: 40.7 mmHg (ref 35.0–45.0)
pCO2 arterial: 36 mmHg (ref 35.0–45.0)
pCO2 arterial: 38.9 mmHg (ref 35.0–45.0)
pH, Arterial: 6.997 — CL (ref 7.350–7.450)
pH, Arterial: 7.219 — ABNORMAL LOW (ref 7.350–7.450)
pH, Arterial: 7.206 — ABNORMAL LOW (ref 7.350–7.450)
pO2, Arterial: 45 mmHg — ABNORMAL LOW (ref 80.0–100.0)
pO2, Arterial: 69 mmHg — ABNORMAL LOW (ref 80.0–100.0)
pO2, Arterial: 46 mmHg — ABNORMAL LOW (ref 80.0–100.0)
pO2, Arterial: 69 mmHg — ABNORMAL LOW (ref 80.0–100.0)

## 2016-01-22 LAB — TROPONIN I
Troponin I: >65 ng/mL (ref <0.03)
Troponin I: >65 ng/mL (ref <0.03)

## 2016-01-22 LAB — LACTIC ACID, PLASMA
Lactic Acid, Venous: 15.4 mmol/L (ref 0.5–1.9)
Lactic Acid, Venous: 14.1 mmol/L (ref 0.5–1.9)

## 2016-01-22 LAB — POCT ACTIVATED CLOTTING TIME
ACTIVATED CLOTTING TIME: 186 s
ACTIVATED CLOTTING TIME: 191 s
ACTIVATED CLOTTING TIME: 197 s
ACTIVATED CLOTTING TIME: 197 s
ACTIVATED CLOTTING TIME: 197 s
ACTIVATED CLOTTING TIME: 296 s
ACTIVATED CLOTTING TIME: 444 s
Activated Clotting Time: 301 seconds
Activated Clotting Time: 285 seconds
Activated Clotting Time: 274 seconds
Activated Clotting Time: 257 seconds
Activated Clotting Time: 235 seconds
Activated Clotting Time: 169 seconds
Activated Clotting Time: 213 seconds
Activated Clotting Time: 202 seconds
Activated Clotting Time: 208 seconds
Activated Clotting Time: 180 seconds
Activated Clotting Time: 191 seconds
Activated Clotting Time: 197 seconds

## 2016-01-22 LAB — ECHOCARDIOGRAM LIMITED
HEIGHTINCHES: 65 in
Height: 65 in
WEIGHTICAEL: 3139.35 [oz_av]
Weight: 3128.77 oz

## 2016-01-22 LAB — PROTIME-INR
INR: 4.74
INR: 2.76
Prothrombin Time: 45.8 seconds — ABNORMAL HIGH (ref 11.4–15.2)
Prothrombin Time: 29.7 seconds — ABNORMAL HIGH (ref 11.4–15.2)

## 2016-01-22 LAB — CARBOXYHEMOGLOBIN
Carboxyhemoglobin: 1.4 % (ref 0.5–1.5)
Methemoglobin: 0.7 % (ref 0.0–1.5)
O2 SAT: 82.8 %
TOTAL HEMOGLOBIN: 13.4 g/dL (ref 12.0–16.0)

## 2016-01-22 LAB — HEPATIC FUNCTION PANEL
ALBUMIN: 1.9 g/dL — AB (ref 3.5–5.0)
ALT: 228 U/L — AB (ref 14–54)
AST: 471 U/L — AB (ref 15–41)
Alkaline Phosphatase: 136 U/L — ABNORMAL HIGH (ref 38–126)
BILIRUBIN INDIRECT: 0.6 mg/dL (ref 0.3–0.9)
Bilirubin, Direct: 0.8 mg/dL — ABNORMAL HIGH (ref 0.1–0.5)
TOTAL PROTEIN: 3.9 g/dL — AB (ref 6.5–8.1)
Total Bilirubin: 1.4 mg/dL — ABNORMAL HIGH (ref 0.3–1.2)

## 2016-01-22 LAB — MAGNESIUM: Magnesium: 1.9 mg/dL (ref 1.7–2.4)

## 2016-01-22 LAB — URINE MICROSCOPIC-ADD ON

## 2016-01-22 LAB — APTT
APTT: 126 s — AB (ref 24–36)
aPTT: 191 seconds (ref 24–36)

## 2016-01-22 LAB — LACTATE DEHYDROGENASE: LDH: 1507 U/L — AB (ref 98–192)

## 2016-01-22 LAB — CORTISOL: CORTISOL PLASMA: 47 ug/dL

## 2016-01-22 LAB — MRSA PCR SCREENING: MRSA BY PCR: NEGATIVE

## 2016-01-22 MED ORDER — TICAGRELOR 90 MG PO TABS
90.0000 mg | ORAL_TABLET | Freq: Two times a day (BID) | ORAL | Status: DC
  Administered 2016-01-22 – 2016-01-23 (×3): 90 mg via ORAL
  Filled 2016-01-22 (×3): qty 1

## 2016-01-22 MED ORDER — NITROGLYCERIN 1 MG/10 ML FOR IR/CATH LAB
INTRA_ARTERIAL | Status: AC
  Filled 2016-01-22: qty 10

## 2016-01-22 MED ORDER — POTASSIUM CHLORIDE 10 MEQ/50ML IV SOLN
10.0000 meq | INTRAVENOUS | Status: DC
  Filled 2016-01-22: qty 50

## 2016-01-22 MED ORDER — SODIUM CHLORIDE 0.9 % IV SOLN
2000.0000 mL | Freq: Once | INTRAVENOUS | Status: AC
  Administered 2016-01-22: 2000 mL via INTRAVENOUS

## 2016-01-22 MED ORDER — MILRINONE LACTATE IN DEXTROSE 20-5 MG/100ML-% IV SOLN
0.1250 ug/kg/min | INTRAVENOUS | Status: DC
  Administered 2016-01-22: 0.125 ug/kg/min via INTRAVENOUS
  Filled 2016-01-22: qty 100

## 2016-01-22 MED ORDER — POTASSIUM CHLORIDE 20 MEQ/15ML (10%) PO SOLN
40.0000 meq | Freq: Once | ORAL | Status: AC
  Administered 2016-01-22: 40 meq
  Filled 2016-01-22: qty 30

## 2016-01-22 MED ORDER — SODIUM BICARBONATE 8.4 % IV SOLN
100.0000 meq | Freq: Once | INTRAVENOUS | Status: AC
  Administered 2016-01-22: 100 meq via INTRAVENOUS

## 2016-01-22 MED ORDER — ONDANSETRON HCL 4 MG/2ML IJ SOLN: 4.0000 mg | Freq: Four times a day (QID) | INTRAMUSCULAR | Status: DC | PRN

## 2016-01-22 MED ORDER — SODIUM CHLORIDE 0.9 % IV SOLN
INTRAVENOUS | Status: DC
  Administered 2016-01-22: 5.4 [IU]/h via INTRAVENOUS
  Administered 2016-01-22: 17:00:00 via INTRAVENOUS
  Administered 2016-01-23: 32.1 [IU]/h via INTRAVENOUS
  Administered 2016-01-23: 24.6 [IU]/h via INTRAVENOUS
  Filled 2016-01-22 (×3): qty 2.5

## 2016-01-22 MED ORDER — STERILE WATER FOR INJECTION IV SOLN
INTRAVENOUS | Status: DC
  Administered 2016-01-22 – 2016-01-23 (×4): via INTRAVENOUS
  Filled 2016-01-22 (×8): qty 850

## 2016-01-22 MED ORDER — SODIUM BICARBONATE 8.4 % IV SOLN
INTRAVENOUS | Status: AC
  Filled 2016-01-22: qty 50

## 2016-01-22 MED ORDER — SODIUM BICARBONATE 8.4 % IV SOLN
INTRAVENOUS | Status: AC
  Administered 2016-01-22: 50 meq
  Filled 2016-01-22: qty 50

## 2016-01-22 MED ORDER — SODIUM CHLORIDE 0.9 % IV SOLN: 250.0000 mL | INTRAVENOUS | Status: DC | PRN

## 2016-01-22 MED ORDER — LIDOCAINE HCL (PF) 1 % IJ SOLN
INTRAMUSCULAR | Status: DC | PRN
  Administered 2016-01-22: 20 mL

## 2016-01-22 MED ORDER — POTASSIUM CHLORIDE 10 MEQ/50ML IV SOLN
10.0000 meq | INTRAVENOUS | Status: AC
  Administered 2016-01-22 (×3): 10 meq via INTRAVENOUS
  Filled 2016-01-22 (×3): qty 50

## 2016-01-22 MED ORDER — ACETAMINOPHEN 325 MG PO TABS: 650.0000 mg | ORAL_TABLET | ORAL | Status: DC | PRN

## 2016-01-22 MED ORDER — SODIUM CHLORIDE 0.9% FLUSH
3.0000 mL | INTRAVENOUS | Status: DC | PRN
Start: 2016-01-22 — End: 2016-01-22

## 2016-01-22 MED ORDER — HEPARIN SODIUM (PORCINE) 5000 UNIT/ML IJ SOLN
50000.0000 [IU] | INTRAVENOUS | Status: DC
  Administered 2016-01-22 – 2016-01-23 (×2): 50000 [IU]
  Filled 2016-01-22 (×2): qty 10

## 2016-01-22 MED ORDER — SODIUM CHLORIDE 0.9 % IV SOLN
4.0000 ug/kg/min | INTRAVENOUS | Status: AC
  Administered 2016-01-22 (×2): 4 ug/kg/min via INTRAVENOUS
  Filled 2016-01-22 (×2): qty 50

## 2016-01-22 MED ORDER — LIDOCAINE BOLUS VIA INFUSION
100.0000 mg | Freq: Once | INTRAVENOUS | Status: DC
  Filled 2016-01-22: qty 100

## 2016-01-22 MED ORDER — NOREPINEPHRINE BITARTRATE 1 MG/ML IV SOLN
0.0000 ug/min | INTRAVENOUS | Status: DC
  Filled 2016-01-22 (×2): qty 4

## 2016-01-22 MED ORDER — SODIUM BICARBONATE 8.4 % IV SOLN
INTRAVENOUS | Status: DC | PRN
  Administered 2016-01-22 (×4): 50 meq via INTRAVENOUS

## 2016-01-22 MED ORDER — CISATRACURIUM BOLUS VIA INFUSION
0.0500 mg/kg | INTRAVENOUS | Status: DC | PRN
  Filled 2016-01-22: qty 5

## 2016-01-22 MED ORDER — LIDOCAINE HCL (CARDIAC) 20 MG/ML IV SOLN
100.0000 mg | Freq: Once | INTRAVENOUS | Status: AC
  Administered 2016-01-22: 100 mg via INTRAVENOUS

## 2016-01-22 MED ORDER — ANTISEPTIC ORAL RINSE SOLUTION (CORINZ)
7.0000 mL | OROMUCOSAL | Status: DC
Start: 2016-01-22 — End: 2016-01-24
  Administered 2016-01-22 – 2016-01-24 (×16): 7 mL via OROMUCOSAL

## 2016-01-22 MED ORDER — CISATRACURIUM BOLUS VIA INFUSION
0.1000 mg/kg | Freq: Once | INTRAVENOUS | Status: AC
  Administered 2016-01-22: 8.9 mg via INTRAVENOUS
  Filled 2016-01-22: qty 9

## 2016-01-22 MED ORDER — NOREPINEPHRINE BITARTRATE 1 MG/ML IV SOLN
INTRAVENOUS | Status: AC
  Filled 2016-01-22: qty 4

## 2016-01-22 MED ORDER — SODIUM CHLORIDE 0.9% FLUSH: 3.0000 mL | Freq: Two times a day (BID) | INTRAVENOUS | Status: DC

## 2016-01-22 MED ORDER — ASPIRIN 81 MG PO CHEW
81.0000 mg | CHEWABLE_TABLET | Freq: Every day | ORAL | Status: DC
  Administered 2016-01-23: 81 mg
  Filled 2016-01-22: qty 1

## 2016-01-22 MED ORDER — FAMOTIDINE IN NACL 20-0.9 MG/50ML-% IV SOLN: 20.0000 mg | INTRAVENOUS | Status: DC

## 2016-01-22 MED ORDER — FUROSEMIDE 10 MG/ML IJ SOLN
160.0000 mg | Freq: Once | INTRAMUSCULAR | Status: AC
  Administered 2016-01-22: 160 mg via INTRAVENOUS
  Filled 2016-01-22: qty 16

## 2016-01-22 MED ORDER — BIVALIRUDIN 250 MG IV SOLR
INTRAVENOUS | Status: AC
  Filled 2016-01-22: qty 250

## 2016-01-22 MED ORDER — SODIUM CHLORIDE 0.9 % IV SOLN
INTRAVENOUS | Status: DC | PRN
  Administered 2016-01-22: 4 ug/kg/min via INTRAVENOUS

## 2016-01-22 MED ORDER — VANCOMYCIN HCL 10 G IV SOLR
1750.0000 mg | Freq: Once | INTRAVENOUS | Status: AC
  Administered 2016-01-22: 1750 mg via INTRAVENOUS
  Filled 2016-01-22: qty 1750

## 2016-01-22 MED ORDER — EPINEPHRINE HCL 0.1 MG/ML IJ SOSY
PREFILLED_SYRINGE | INTRAMUSCULAR | Status: AC
  Filled 2016-01-22: qty 10

## 2016-01-22 MED ORDER — AMIODARONE HCL IN DEXTROSE 360-4.14 MG/200ML-% IV SOLN
30.0000 mg/h | INTRAVENOUS | Status: DC
  Administered 2016-01-22: 30 mg/h via INTRAVENOUS

## 2016-01-22 MED ORDER — SODIUM CHLORIDE 0.9 % IV SOLN
INTRAVENOUS | Status: DC
  Administered 2016-01-22: 5.4 [IU]/h via INTRAVENOUS
  Filled 2016-01-22: qty 2.5

## 2016-01-22 MED ORDER — FAMOTIDINE IN NACL 20-0.9 MG/50ML-% IV SOLN
20.0000 mg | Freq: Two times a day (BID) | INTRAVENOUS | Status: DC
  Administered 2016-01-22: 20 mg via INTRAVENOUS
  Filled 2016-01-22: qty 50

## 2016-01-22 MED ORDER — ADENOSINE (DIAGNOSTIC) FOR INTRACORONARY USE
INTRAVENOUS | Status: DC | PRN
  Administered 2016-01-22 (×4): 60 mg via INTRACORONARY

## 2016-01-22 MED ORDER — MIDAZOLAM HCL 5 MG/ML IJ SOLN
1.0000 mg/h | INTRAMUSCULAR | Status: DC
  Administered 2016-01-22: 2 mg/h via INTRAVENOUS
  Administered 2016-01-22 – 2016-01-23 (×2): 4 mg/h via INTRAVENOUS
  Filled 2016-01-22 (×3): qty 10

## 2016-01-22 MED ORDER — HEPARIN (PORCINE) IN NACL 100-0.45 UNIT/ML-% IJ SOLN
200.0000 [IU]/h | INTRAMUSCULAR | Status: DC
  Filled 2016-01-22: qty 250

## 2016-01-22 MED ORDER — IOPAMIDOL (ISOVUE-370) INJECTION 76%
INTRAVENOUS | Status: AC
  Filled 2016-01-22: qty 100

## 2016-01-22 MED ORDER — CANGRELOR BOLUS VIA INFUSION
INTRAVENOUS | Status: DC | PRN
  Administered 2016-01-22: 2655 ug via INTRAVENOUS

## 2016-01-22 MED ORDER — CANGRELOR TETRASODIUM 50 MG IV SOLR
INTRAVENOUS | Status: AC
  Filled 2016-01-22: qty 50

## 2016-01-22 MED ORDER — FENTANYL BOLUS VIA INFUSION
25.0000 ug | INTRAVENOUS | Status: DC | PRN
  Filled 2016-01-22: qty 25

## 2016-01-22 MED ORDER — ADENOSINE 6 MG/2ML IV SOLN
INTRAVENOUS | Status: AC
  Filled 2016-01-22: qty 2

## 2016-01-22 MED ORDER — HEPARIN (PORCINE) IN NACL 2-0.9 UNIT/ML-% IJ SOLN
INTRAMUSCULAR | Status: DC | PRN
  Administered 2016-01-22: 1000 mL

## 2016-01-22 MED ORDER — FUROSEMIDE 10 MG/ML IJ SOLN
120.0000 mg | Freq: Once | INTRAVENOUS | Status: AC
  Administered 2016-01-22: 120 mg via INTRAVENOUS
  Filled 2016-01-22: qty 12

## 2016-01-22 MED ORDER — SODIUM CHLORIDE 0.9% FLUSH: 10.0000 mL | INTRAVENOUS | Status: DC | PRN

## 2016-01-22 MED ORDER — NOREPINEPHRINE BITARTRATE 1 MG/ML IV SOLN
0.0000 ug/min | INTRAVENOUS | Status: DC
  Administered 2016-01-22: 100 ug/min via INTRAVENOUS
  Administered 2016-01-22: 30 ug/min via INTRAVENOUS
  Administered 2016-01-22: 25 ug/min via INTRAVENOUS
  Administered 2016-01-22 – 2016-01-23 (×2): 100 ug/min via INTRAVENOUS
  Administered 2016-01-23: 65 ug/min via INTRAVENOUS
  Administered 2016-01-23: 100 ug/min via INTRAVENOUS
  Administered 2016-01-23: 60 ug/min via INTRAVENOUS
  Administered 2016-01-23: 90 ug/min via INTRAVENOUS
  Administered 2016-01-24: 100 ug/min via INTRAVENOUS
  Filled 2016-01-22 (×15): qty 16

## 2016-01-22 MED ORDER — AMIODARONE HCL IN DEXTROSE 360-4.14 MG/200ML-% IV SOLN
60.0000 mg/h | INTRAVENOUS | Status: DC
  Administered 2016-01-22: 60 mg/h via INTRAVENOUS

## 2016-01-22 MED ORDER — ARTIFICIAL TEARS OP OINT
1.0000 "application " | TOPICAL_OINTMENT | Freq: Three times a day (TID) | OPHTHALMIC | Status: DC
  Administered 2016-01-22 – 2016-01-23 (×6): 1 via OPHTHALMIC
  Filled 2016-01-22 (×2): qty 3.5

## 2016-01-22 MED ORDER — AMIODARONE HCL IN DEXTROSE 360-4.14 MG/200ML-% IV SOLN: 30.0000 mg/h | INTRAVENOUS | Status: DC

## 2016-01-22 MED ORDER — DEXTROSE 5 % IV SOLN
INTRAVENOUS | Status: DC
  Administered 2016-01-22: 06:00:00 via INTRAVENOUS
  Filled 2016-01-22 (×3): qty 150

## 2016-01-22 MED ORDER — VANCOMYCIN HCL IN DEXTROSE 1-5 GM/200ML-% IV SOLN
1000.0000 mg | INTRAVENOUS | Status: DC
Start: 2016-01-22 — End: 2016-01-22
  Filled 2016-01-22: qty 200

## 2016-01-22 MED ORDER — PIPERACILLIN-TAZOBACTAM 3.375 G IVPB 30 MIN
3.3750 g | Freq: Once | INTRAVENOUS | Status: AC
  Administered 2016-01-22: 3.375 g via INTRAVENOUS
  Filled 2016-01-22: qty 50

## 2016-01-22 MED ORDER — SODIUM BICARBONATE 8.4 % IV SOLN: 100.0000 meq | Freq: Once | INTRAVENOUS | Status: AC

## 2016-01-22 MED ORDER — SODIUM CHLORIDE 0.9% FLUSH
10.0000 mL | Freq: Two times a day (BID) | INTRAVENOUS | Status: DC
  Administered 2016-01-22 – 2016-01-23 (×3): 10 mL

## 2016-01-22 MED ORDER — PROPOFOL 1000 MG/100ML IV EMUL
25.0000 ug/kg/min | INTRAVENOUS | Status: DC
  Filled 2016-01-22: qty 100

## 2016-01-22 MED ORDER — ASPIRIN 81 MG PO CHEW: 81.0000 mg | CHEWABLE_TABLET | Freq: Every day | ORAL | Status: DC

## 2016-01-22 MED ORDER — MIDAZOLAM HCL 2 MG/2ML IJ SOLN
1.0000 mg | INTRAMUSCULAR | Status: DC | PRN
  Administered 2016-01-22: 2 mg via INTRAVENOUS

## 2016-01-22 MED ORDER — TICAGRELOR 90 MG PO TABS: 90.0000 mg | ORAL_TABLET | Freq: Two times a day (BID) | ORAL | Status: DC

## 2016-01-22 MED ORDER — SODIUM CHLORIDE 0.9 % IV SOLN
INTRAVENOUS | Status: DC
  Administered 2016-01-22: 10 mL/h via INTRAVENOUS

## 2016-01-22 MED ORDER — POTASSIUM CHLORIDE 10 MEQ/50ML IV SOLN
10.0000 meq | INTRAVENOUS | Status: AC
  Administered 2016-01-22 (×4): 10 meq via INTRAVENOUS
  Filled 2016-01-22 (×2): qty 50

## 2016-01-22 MED ORDER — FUROSEMIDE 10 MG/ML IJ SOLN
INTRAMUSCULAR | Status: AC
  Filled 2016-01-22: qty 4

## 2016-01-22 MED ORDER — FENTANYL CITRATE (PF) 100 MCG/2ML IJ SOLN
INTRAMUSCULAR | Status: AC
  Filled 2016-01-22: qty 2

## 2016-01-22 MED ORDER — PIPERACILLIN-TAZOBACTAM 3.375 G IVPB
3.3750 g | Freq: Three times a day (TID) | INTRAVENOUS | Status: DC
  Administered 2016-01-22 – 2016-01-23 (×2): 3.375 g via INTRAVENOUS
  Filled 2016-01-22 (×3): qty 50

## 2016-01-22 MED ORDER — NOREPINEPHRINE BITARTRATE 1 MG/ML IV SOLN
INTRAVENOUS | Status: DC | PRN
  Administered 2016-01-22: 10 ug/min via INTRAVENOUS

## 2016-01-22 MED ORDER — SODIUM CHLORIDE 0.9 % IV SOLN
2.0000 g | Freq: Once | INTRAVENOUS | Status: AC
  Administered 2016-01-22: 2 g via INTRAVENOUS
  Filled 2016-01-22: qty 20

## 2016-01-22 MED ORDER — FENTANYL CITRATE (PF) 100 MCG/2ML IJ SOLN
50.0000 ug | Freq: Once | INTRAMUSCULAR | Status: AC
  Administered 2016-01-22: 50 ug via INTRAVENOUS

## 2016-01-22 MED ORDER — SODIUM CHLORIDE 0.9 % IV SOLN
INTRAVENOUS | Status: DC | PRN
  Administered 2016-01-22: 1.75 mg/kg/h via INTRAVENOUS

## 2016-01-22 MED ORDER — IOPAMIDOL (ISOVUE-370) INJECTION 76%
INTRAVENOUS | Status: DC | PRN
  Administered 2016-01-22: 175 mL via INTRA_ARTERIAL

## 2016-01-22 MED ORDER — ADENOSINE 6 MG/2ML IV SOLN
INTRAVENOUS | Status: DC | PRN
  Administered 2016-01-22 (×2): 6 mg via INTRAVENOUS

## 2016-01-22 MED ORDER — EPINEPHRINE HCL 1 MG/ML IJ SOLN
0.5000 ug/min | INTRAMUSCULAR | Status: DC
  Administered 2016-01-22 (×2): 20 ug/min via INTRAVENOUS
  Administered 2016-01-22: 2 ug/min via INTRAVENOUS
  Administered 2016-01-22 – 2016-01-23 (×3): 20 ug/min via INTRAVENOUS
  Administered 2016-01-23: 22 ug/min via INTRAVENOUS
  Administered 2016-01-23: 20 ug/min via INTRAVENOUS
  Administered 2016-01-23 – 2016-01-24 (×2): 22 ug/min via INTRAVENOUS
  Filled 2016-01-22 (×13): qty 4

## 2016-01-22 MED ORDER — AMIODARONE HCL IN DEXTROSE 360-4.14 MG/200ML-% IV SOLN: 60.0000 mg/h | INTRAVENOUS | Status: AC

## 2016-01-22 MED ORDER — MAGNESIUM SULFATE 2 GM/50ML IV SOLN
2.0000 g | Freq: Once | INTRAVENOUS | Status: AC
Start: 2016-01-22 — End: 2016-01-22
  Administered 2016-01-22: 2 g via INTRAVENOUS

## 2016-01-22 MED ORDER — LIDOCAINE IN D5W 4-5 MG/ML-% IV SOLN
1.0000 mg/min | INTRAVENOUS | Status: DC
  Administered 2016-01-22 – 2016-01-23 (×2): 1 mg/min via INTRAVENOUS
  Filled 2016-01-22 (×2): qty 500

## 2016-01-22 MED ORDER — EPINEPHRINE HCL 0.1 MG/ML IJ SOSY
PREFILLED_SYRINGE | INTRAMUSCULAR | Status: DC | PRN
  Administered 2016-01-22 (×6): 1 mg via INTRAVENOUS

## 2016-01-22 MED ORDER — SODIUM BICARBONATE 8.4 % IV SOLN
INTRAVENOUS | Status: DC
  Administered 2016-01-22: 14:00:00 via INTRAVENOUS
  Filled 2016-01-22 (×4): qty 150

## 2016-01-22 MED ORDER — LIDOCAINE HCL (PF) 1 % IJ SOLN
INTRAMUSCULAR | Status: DC | PRN
  Administered 2016-01-21: 3 mL

## 2016-01-22 MED ORDER — TICAGRELOR 90 MG PO TABS
180.0000 mg | ORAL_TABLET | Freq: Once | ORAL | Status: AC
  Administered 2016-01-22: 180 mg via ORAL
  Filled 2016-01-22: qty 2

## 2016-01-22 MED ORDER — IOPAMIDOL (ISOVUE-370) INJECTION 76%
INTRAVENOUS | Status: AC
  Filled 2016-01-22: qty 125

## 2016-01-22 MED ORDER — POTASSIUM CHLORIDE 10 MEQ/50ML IV SOLN
10.0000 meq | INTRAVENOUS | Status: AC
  Administered 2016-01-22 – 2016-01-23 (×6): 10 meq via INTRAVENOUS
  Filled 2016-01-22 (×6): qty 50

## 2016-01-22 MED ORDER — CHLORHEXIDINE GLUCONATE 0.12% ORAL RINSE (MEDLINE KIT)
15.0000 mL | Freq: Two times a day (BID) | OROMUCOSAL | Status: DC
  Administered 2016-01-22 – 2016-01-23 (×4): 15 mL via OROMUCOSAL

## 2016-01-22 MED ORDER — SODIUM CHLORIDE 0.9 % IV SOLN
1.0000 ug/kg/min | INTRAVENOUS | Status: DC
  Administered 2016-01-22 – 2016-01-23 (×2): 1.5 ug/kg/min via INTRAVENOUS
  Filled 2016-01-22 (×3): qty 20

## 2016-01-22 MED ORDER — SODIUM BICARBONATE 8.4 % IV SOLN
50.0000 meq | Freq: Once | INTRAVENOUS | Status: AC
  Administered 2016-01-22: 50 meq via INTRAVENOUS

## 2016-01-22 MED ORDER — SODIUM CHLORIDE 0.9 % IV SOLN
100.0000 ug/h | INTRAVENOUS | Status: DC
  Administered 2016-01-22: 100 ug/h via INTRAVENOUS
  Administered 2016-01-22 – 2016-01-24 (×4): 250 ug/h via INTRAVENOUS
  Filled 2016-01-22 (×6): qty 50

## 2016-01-22 MED ORDER — SODIUM CHLORIDE 0.9% FLUSH: 3.0000 mL | INTRAVENOUS | Status: DC | PRN

## 2016-01-22 MED ORDER — INSULIN GLARGINE 100 UNIT/ML ~~LOC~~ SOLN
20.0000 [IU] | Freq: Once | SUBCUTANEOUS | Status: AC
  Administered 2016-01-22: 20 [IU] via SUBCUTANEOUS
  Filled 2016-01-22: qty 0.2

## 2016-01-22 MED ORDER — MAGNESIUM SULFATE 2 GM/50ML IV SOLN
INTRAVENOUS | Status: AC
  Filled 2016-01-22: qty 50

## 2016-01-22 MED ORDER — VANCOMYCIN HCL IN DEXTROSE 1-5 GM/200ML-% IV SOLN
1000.0000 mg | INTRAVENOUS | Status: DC
  Administered 2016-01-23: 1000 mg via INTRAVENOUS
  Filled 2016-01-22 (×2): qty 200

## 2016-01-22 MED ORDER — INSULIN ASPART 100 UNIT/ML ~~LOC~~ SOLN: 2.0000 [IU] | SUBCUTANEOUS | Status: DC

## 2016-01-22 MED ORDER — SODIUM CHLORIDE 0.9 % IV SOLN
0.0300 [IU]/min | INTRAVENOUS | Status: DC
  Administered 2016-01-22: 0.03 [IU]/min via INTRAVENOUS
  Filled 2016-01-22 (×3): qty 2

## 2016-01-22 MED ORDER — FUROSEMIDE 10 MG/ML IJ SOLN
INTRAMUSCULAR | Status: DC | PRN
  Administered 2016-01-22: 40 mg via INTRAVENOUS

## 2016-01-22 MED ORDER — MIDAZOLAM HCL 2 MG/2ML IJ SOLN
INTRAMUSCULAR | Status: AC
  Filled 2016-01-22: qty 2

## 2016-01-22 MED ORDER — CALCIUM GLUCONATE 10 % IV SOLN
2.0000 g | Freq: Once | INTRAVENOUS | Status: DC
  Filled 2016-01-22: qty 20

## 2016-01-22 MED FILL — Heparin Sodium (Porcine) Inj 1000 Unit/ML: INTRAMUSCULAR | Qty: 10 | Status: AC

## 2016-01-22 NOTE — Consult Note (Signed)
PULMONARY / CRITICAL CARE MEDICINE   Name: Tracy Mckee MRN: [id number] DOB: March 10, 1941    ADMISSION DATE:  01-30-16 CONSULTATION DATE:  01/22/16  REFERRING MD:  Dr Donnie Aho, Dr Eldridge Dace  CHIEF COMPLAINT:  Cardiogenic shock and Acute respiratory failure  HISTORY OF PRESENT ILLNESS:   75 year old female has a history of hypertension and diabetes and hyperlipidemia.  She had arthroscopic knee surgery on July 26.  She developed significant substernal chest pain around 9 PM tonight and presented to [facility] emergency room where she was found to have what was felt to be an acute anterior infarction.  She had some ventricular tachycardia in the emergency room was started on intravenous amiodarone.  She has continued to have chest discomfort and was brought by EMS directly to the cath lab approx 11:45pm.  She continued to have chest pain and shortness of breath as well as diaphoresis at the time of presentation.  In the cath lab she was in shock while arterial access was being obtained. Experienced progressive hypoxemia and respiratory distress. She was intubated without sedation at approximately 00:30am. Subsequently experienced PEA and underwent 5 minutes CPR. Has intermittently required pushes of epi, was started on norepi gtt. 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 PTCI was performed. An impella device was placed. ABG reported to have pH 6.8 with a severe metabolic acidosis although not in the medical record yet. Perfusing pressure obtained but no meaningful neurological function at this time off sedation.   SUBJECTIVE:  Refractory shock, acidosis,. impella  VITAL SIGNS: BP (!) 75/52   Pulse (!) 117   Temp (!) 90.3 F (32.4 C) (Core (Comment))   Resp (!) 0   Ht  (1.651 m)   Wt 88.7 kg (195 lb 8.8 oz)   SpO2 (!) 84%   BMI 32.54 kg/m   HEMODYNAMICS: PAP: (11-42)/(7-25) 11/7 CVP:  [11 mmHg-17 mmHg] 15 mmHg PCWP:  [9 mmHg] 9 mmHg CO:  [2.6 L/min] 2.6 L/min CI:  [1.3  L/min/m2] 1.3 L/min/m2  VENTILATOR SETTINGS: Vent Mode: PRVC FiO2 (%):  [100 %] 100 % Set Rate:  [35 bmp] 35 bmp Vt Set:  [500 mL] 500 mL PEEP:  [10 cmH20-12 cmH20] 12 cmH20 Plateau Pressure:  [34 cmH20] 34 cmH20  INTAKE / OUTPUT: I/O last 3 completed shifts: In: 829.2 [I.V.:617.5; Other:161.7; IV Piggyback:50] Out: 80 [Urine:80]  PHYSICAL EXAMINATION: General:  Obese woman, unresponsive, intubated Neuro:  Paralyzed, deep rass assumed, per1 mm HEENT:  OP clear, ETT in place Cardiovascular:  s1 s2 distant, rr, no r Lungs:  ronchi rt greater left Abdomen:  Obese, soft, hypoactive BS, pads Musculoskeletal:  Bruising R knee, R femoral PA-c in place Skin:  Cool and clammy  LABS:  BMET  Recent Labs Lab 01-30-2016 2232 01/22/16 0417 01/22/16 0429 01/22/16 0730  NA 129* 140 138 137  K 3.1* 3.3* 3.3* 3.2*  CL 94* 101 101 101  CO2 20*  --  13* 12*  BUN 18 23* 17 19  CREATININE 1.28* 1.50* 1.76* 1.79*  GLUCOSE 282* 646* 693* 708*    Electrolytes  Recent Labs Lab Jan 30, 2016 2232 01/22/16 0429 01/22/16 0730  CALCIUM 9.2 7.2* 7.2*  MG  --  1.9  --   PHOS  --  8.9*  --     CBC  Recent Labs Lab 01/30/16 2232 01/22/16 0417 01/22/16 0429  WBC 12.3*  --  27.5*  HGB 15.6* 13.6 13.3  HCT 45.5 40.0 41.1  PLT 246  --  339  Coag's  Recent Labs Lab 01/22/16 0429  APTT 191*  INR 4.74*    Sepsis Markers  Recent Labs Lab 01/22/16 0428 01/22/16 0730  LATICACIDVEN 15.4* 14.1*    ABG  Recent Labs Lab 01/22/16 0451  PHART 6.997*  PCO2ART 40.7  PO2ART 46.0*    Liver Enzymes No results for input(s): AST, ALT, ALKPHOS, BILITOT, ALBUMIN in the last 168 hours.  Cardiac Enzymes  Recent Labs Lab 01/22/16 0429 01/22/16 0730  TROPONINI >65.00* >65.00*    Glucose  Recent Labs Lab 01/22/16 0415 01/22/16 0441 01/22/16 0521  GLUCAP >600* >600* >600*    Imaging Dg Chest Port 1 View  Result Date: 01/22/2016 CLINICAL DATA:  Acute respiratory  failure. EXAM: PORTABLE CHEST 1 VIEW COMPARISON:  Most recent chest imaging 06/27/2011 FINDINGS: Endotracheal tube is 4.2 cm from the carina. Impella device projects over the aorta. Probable Swan-Ganz catheter from an inferior approach tip in the region of the left lower lobe pulmonary artery. Dense right lung consolidation with air bronchograms. Lesser findings on the left and perihilar region. The heart size and mediastinal contours are grossly normal. No evidence of pneumothorax or large pleural effusion. No acute displaced rib fracture. IMPRESSION: 1. Endotracheal tube 4.2 cm from the carina. Impella device in place. Probable left Swan-Ganz catheter from an inferior approach is in the region of the left lower lobe pulmonary artery, retraction of a few cm recommended for optimal placement. 2. Dense right perihilar opacity with air bronchograms, with similar findings on the left to lesser extent. Considerations include pulmonary edema, aspiration, pneumonia, or ARDS. Pulmonary edema is favored. Electronically Signed   By: Rubye OaksMelanie  Ehinger M.D.   On: 01/22/2016 03:12   Dg Abd Portable 1v  Result Date: 01/22/2016 CLINICAL DATA:  Orogastric tube placement.  Initial encounter. EXAM: PORTABLE ABDOMEN - 1 VIEW COMPARISON:  CT of the abdomen and pelvis from 06/11/2006 FINDINGS: An enteric tube is noted ending overlying the body of the stomach. The visualized bowel gas pattern is unremarkable. Scattered air and stool filled loops of colon are seen; no abnormal dilatation of small bowel loops is seen to suggest small bowel obstruction. No free intra-abdominal air is identified, though evaluation for free air is limited on a single supine view. The visualized osseous structures are within normal limits; the sacroiliac joints are unremarkable in appearance. Right basilar airspace opacity raises concern for pneumonia. External pacing pads are noted. IMPRESSION: 1. Enteric tube noted ending overlying the body of the  stomach. 2. Unremarkable bowel gas pattern; no free intra-abdominal air seen. 3. Minimal right basilar airspace opacity raises concern for pneumonia. Electronically Signed   By: Roanna RaiderJeffery  Chang M.D.   On: 01/22/2016 04:16     STUDIES:  TTE 8/7 >>  EEG 8/7 >>   CULTURES: 8/7 sputum>>> 8/7 BC>>>  ANTIBIOTICS: 8/7 zosyn>>> 8/7 vanc>>>  SIGNIFICANT EVENTS: 8/6 cath - stent LAD 8/6 coded 8/7- imprella, refractory shock, acidosis  LINES/TUBES: PA-cath R femoral 8/7 >>  Swan groin rt 8/6>>> impella rt 8/6>>> Left IJ 8/7Z>>> Radial A line 8/7>>>  ASSESSMENT / PLAN:  PULMONARY A: Acute hypoxemia respiratory failure due to B infiltrates, acute systolic CHF Dense infiltrate r/o aspiration Refractory acidosis P:   Maintain high rate 35, TV 8 cc/kg Stat abg repeat, then q6h pcxr in am  See ID Keep plat less 30 if able Increase peep 18  CARDIOVASCULAR A:  Acute STEMI S/p PTCI 8/7 Cardiogenic shock [facility] systolic [facility] P:  Anticoag and anti-plts per cardilogy post-cath orders. Amiodarone running cvp get  Get CO / CI Levophed is maxed Add epi drip impella per  chf Get cortisol  RENAL A:   Acute renal failure, cardiogenic shock refractory LA Does NOT appear to be a candidate for CVVHD P:   Lactic trend Support MAP Get wedge, cvp Bicarb will not change outcome and also may increase co2 - dc saline kvo K supp bmet q4h  GASTROINTESTINAL A:   SUP Nutrition  At risk shock liver P:   pepcid will not feed with current critical illness, levophed 85, ph less 7 LFT  HEMATOLOGIC A:   Leukocytosis P:  Follow CBC coagulapathy on own and heparin purge with impella  INFECTIOUS A:   Bad infiltrate rt, asp, recent nosocomial exposure P:   Add vanc Continued zosyn Sputum, BC  ENDOCRINE A:   DM   R/o rel AI P:   Start hyperglycemia protocol  - to drip cortisol  NEUROLOGIC A:   Acute encephalopathy At risk anoxic  brain injury Refractory shock P:   RASS goal: -5 while paralyzed Change from hypothermia to normothermia at 0.5 hr Keep versed, fent eeg if able   FAMILY  - Updates: Updated son at bedside, need to discuss dnr  - Inter-disciplinary family meet or Palliative Care meeting due by:  01/29/16  Ccm time 60 min   Mcarthur Rossetti. Tyson Alias, MD, FACP Pgr: [phone] [facility]

## 2016-01-22 NOTE — Progress Notes (Signed)
Elink notified about ABG results.  Ordered to increase PEEP.

## 2016-01-22 NOTE — Anesthesia Procedure Notes (Signed)
Procedure Name: Intubation Date/Time: 01/22/2016 12:30 AM Performed by: Brien MatesMAHONY, Raini Tiley D Pre-anesthesia Checklist: Patient identified, Suction available, Emergency Drugs available and Patient being monitored Patient Re-evaluated:Patient Re-evaluated prior to inductionOxygen Delivery Method: Circle system utilized Preoxygenation: Pre-oxygenation with 100% oxygen Intubation Type: Cricoid Pressure applied Laryngoscope Size: Glidescope Grade View: Grade I Tube type: Subglottic suction tube Tube size: 7.5 mm Number of attempts: 1 Airway Equipment and Method: Stylet and Video-laryngoscopy Placement Confirmation: ETT inserted through vocal cords under direct vision,  positive ETCO2 and breath sounds checked- equal and bilateral Secured at: 23 cm Tube secured with: Tape Dental Injury: Teeth and Oropharynx as per pre-operative assessment

## 2016-01-22 NOTE — Progress Notes (Signed)
    Impella catheter advanced due to machine saying that positioning was incorrect.  Repositioning was done under echocardiographic guidance at the bedside in the CCU.  Now at 86 cm.  Flows back to 3.1 L/min.  Will follow closely.  Corky CraftsJayadeep S Flavius Repsher, MD

## 2016-01-22 NOTE — Procedures (Signed)
Arterial Catheter Insertion Procedure Note Tracy Mckee [id number] 1940-07-05  Procedure: Insertion of Arterial Catheter  Indications: Blood pressure monitoring and Frequent blood sampling  Procedure Details Consent: Unable to obtain consent because of emergent medical necessity. Time Out: Verified patient identification, verified procedure, site/side was marked, verified correct patient position, special equipment/implants available, medications/allergies/relevent history reviewed, required imaging and test results available.  Performed  Maximum sterile technique was used including antiseptics, cap, gloves, gown, hand hygiene, mask and sheet. Skin prep: Chlorhexidine; local anesthetic administered 20 gauge catheter was inserted into left radial artery using the Seldinger technique.  Evaluation Blood flow good; BP tracing good. Complications: No apparent complications.   Tracy RomneyMackey, Tracy Mckee 01/22/2016

## 2016-01-22 NOTE — Consult Note (Addendum)
PULMONARY / CRITICAL CARE MEDICINE   Name: Tracy Mckee MRN: [id number] DOB: Jul 19, 1940    ADMISSION DATE:  01/26/2016 CONSULTATION DATE:  01/22/16  REFERRING MD:  Dr Donnie Ahoilley, Dr Eldridge DaceVaranasi  CHIEF COMPLAINT:  Cardiogenic shock and Acute respiratory failure  HISTORY OF PRESENT ILLNESS:   75 year old female has a history of hypertension and diabetes and hyperlipidemia.  She had arthroscopic knee surgery on July 26.  She developed significant substernal chest pain around 9 PM tonight and presented to [facility] emergency room where she was found to have what was felt to be an acute anterior infarction.  She had some ventricular tachycardia in the emergency room was started on intravenous amiodarone.  She has continued to have chest discomfort and was brought by EMS directly to the cath lab approx 11:45pm.  She continued to have chest pain and shortness of breath as well as diaphoresis at the time of presentation.  In the cath lab she was in shock while arterial access was being obtained. Experienced progressive hypoxemia and respiratory distress. She was intubated without sedation at approximately 00:30am. Subsequently experienced PEA and underwent 5 minutes CPR. Has intermittently required pushes of epi, was started on norepi gtt. 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 PTCI was performed. An impella device was placed. ABG reported to have pH 6.8 with a severe metabolic acidosis although not in the medical record yet. Perfusing pressure obtained but no meaningful neurological function at this time off sedation.    PAST MEDICAL HISTORY :  She  has a past medical history of Diabetes mellitus without complication (HCC); Diverticulitis; Edema; GERD (gastroesophageal reflux disease); Hyperlipidemia; Hypertension; and Osteoarthritis of both knees.  PAST SURGICAL HISTORY: She  has a past surgical history that includes Ovarian cyst removal; Esophageal dilation; and Knee arthroscopy with medial  menisectomy (Right, 01/10/2016).  Allergies  Allergen Reactions  . Baclofen   . Ciprofloxacin   . Hydrocodone-Acetaminophen Other (See Comments)    Patient states it made her feel like she was having a heart attack  . Other Swelling    Chest tightness-2 unknown antibiotics (One starts with a "B" and the other, a "C")    No current facility-administered medications on file prior to encounter.    Current Outpatient Prescriptions on File Prior to Encounter  Medication Sig  . aspirin EC 81 MG tablet Take 81 mg by mouth daily.  Marland Kitchen. CINNAMON PO Take 1 g by mouth 2 (two) times daily.  . ciprofloxacin (CIPRO) 500 MG tablet Take 1 tablet (500 mg total) by mouth 2 (two) times daily.  . fluticasone (FLONASE) 50 MCG/ACT nasal spray Place into both nostrils daily.  Marland Kitchen. glimepiride (AMARYL) 2 MG tablet Take 2 mg by mouth daily with breakfast.  . metFORMIN (GLUCOPHAGE-XR) 500 MG 24 hr tablet Take 500 mg by mouth 2 (two) times daily.  . metroNIDAZOLE (FLAGYL) 500 MG tablet Take 1 tablet (500 mg total) by mouth 3 (three) times daily.  . Omega-3 Fatty Acids (FISH OIL) 1000 MG CAPS Take 1 capsule by mouth daily.  Marland Kitchen. omeprazole (PRILOSEC) 40 MG capsule Take 40 mg by mouth daily.  . traMADol (ULTRAM) 50 MG tablet Take 1 tablet (50 mg total) by mouth every 6 (six) hours as needed. (Patient taking differently: Take 50 mg by mouth every 6 (six) hours as needed for moderate pain. )  . triamterene-hydrochlorothiazide (MAXZIDE-25) 37.5-25 MG per tablet Take 1 tablet by mouth daily.    FAMILY HISTORY:  Her indicated that her mother is  deceased. She indicated that her father is deceased. She indicated that her sister is deceased. She indicated that her brother is alive.    SOCIAL HISTORY: She  reports that she has never smoked. She has never used smokeless tobacco. She reports that she drinks alcohol. She reports that she does not use drugs.  REVIEW OF SYSTEMS:   Unable to obtain  SUBJECTIVE:  Unable to  obtain  VITAL SIGNS: BP 138/87   Pulse 107   Resp (!) 36   SpO2 (!) 88%   HEMODYNAMICS:    VENTILATOR SETTINGS: Vent Mode: PRVC FiO2 (%):  [100 %] 100 % Set Rate:  [35 bmp] 35 bmp Vt Set:  [500 mL] 500 mL PEEP:  [10 cmH20] 10 cmH20  INTAKE / OUTPUT: No intake/output data recorded.  PHYSICAL EXAMINATION: General:  Obese woman, unresponsive, intubated Neuro:  Unresponsive to pain and voice off sedation, weak gag HEENT:  OP clear, ETT in place, pupils 3mm and react sluggishly Cardiovascular:  Tachy, distant no M Lungs:  B diffuse insp and exp crackles Abdomen:  Obese, soft, hypoactive BS Musculoskeletal:  Bruising R knee, R femoral PA-c in place Skin:  Cool and clammy  LABS:  BMET  Recent Labs Lab February 05, 2016 2232  NA 129*  K 3.1*  CL 94*  CO2 20*  BUN 18  CREATININE 1.28*  GLUCOSE 282*    Electrolytes  Recent Labs Lab Feb 05, 2016 2232  CALCIUM 9.2    CBC  Recent Labs Lab 02-05-16 2232  WBC 12.3*  HGB 15.6*  HCT 45.5  PLT 246    Coag's No results for input(s): APTT, INR in the last 168 hours.  Sepsis Markers No results for input(s): LATICACIDVEN, PROCALCITON, O2SATVEN in the last 168 hours.  ABG No results for input(s): PHART, PCO2ART, PO2ART in the last 168 hours.  Liver Enzymes No results for input(s): AST, ALT, ALKPHOS, BILITOT, ALBUMIN in the last 168 hours.  Cardiac Enzymes No results for input(s): TROPONINI, PROBNP in the last 168 hours.  Glucose No results for input(s): GLUCAP in the last 168 hours.  Imaging No results found.   STUDIES:  TTE 8/7 >>  EEG 8/7 >>   CULTURES:  ANTIBIOTICS:  SIGNIFICANT EVENTS:  LINES/TUBES: PA-cath R femoral 8/7 >>   DISCUSSION: 75 yo woman with HTN, DM, hyperlipidemia. To [facility] ED with anterior STEMI 8/6 pm, c/b shock. Brought urgently for cath, PTCI to the ostial circ and PAD. Developed acute respiratory failure on the table and was ET intubated, reportedly without sedation. Experienced  PEA, 5 minutes CPR with ROSC. Infusing norepi and impella device placed. Subsequent ABG consistent with severe metabolic acidosis. Very concerning that she has made no neurological progress, remains unresponsive. Even so, given the very short duration of pulselessness and her good MS during the first portion of the cath, I believe it is appropriate to initiate protective hypothermia. Will start the cooling process now.   ASSESSMENT / PLAN:  PULMONARY A: Acute hypoxemia respiratory failure due to B infiltrates, acute systolic CHF P:   PRVC 8cc/kg ABG now and adjust, will need to maximize Ve given her severe metabolic acidosis VAP prevention bundle  CARDIOVASCULAR A:  Acute STEMI S/p PTCI 8/7 Cardiogenic shock Acute systolic CHF VT reported at [facility] on initial eval P:  Anticoag and anti-plts per cardilogy post-cath orders. Amiodarone running She will require diuresis but no room to do so given current hemodynamics. Will initiate when able to do so  Place Art line PA-c in place, will  follow CO/CI  RENAL A:   Acute renal failure P:   Follow BMP now, UOP. Anticipate that renal fxn will worsen given hypoperfusion and dye load Lactic acid now and in am  GASTROINTESTINAL A:   SUP Nutrition  P:   pepcid Consider TF soon  HEMATOLOGIC A:   No acute issues P:  Follow CBC on anti-coag and anti-plt  INFECTIOUS A:   No evidence acute infection P:   Follow clinically, low threshold for empiric abx given B pulmonary infiltrates (likely edema)  ENDOCRINE A:   DM   P:   Start hyperglycemia protocol   NEUROLOGIC A:   Acute encephalopathy At risk anoxic brain injury P:   RASS goal: -5 while paralyzed Propofol + fentanyl gtt's Check EEG and portable CT head   FAMILY  - Updates: Updated husband in waiting room 8/7  - Inter-disciplinary family meet or Palliative Care meeting due by:  01/29/16  Independent CC time 60 minutes   Levy Pupa, MD, PhD 01/22/2016, 3:07  AM [facility] [phone] or if no answer [phone]

## 2016-01-22 NOTE — Progress Notes (Signed)
   01/22/16 0600  Spiritual Encounters  Spiritual Needs Prayer;Emotional  Chaplain responded to page for emotional support of patient's  husband.  Chaplain prayed and remained with husband, son and daughter in law.  Husband emotionally upset.

## 2016-01-22 NOTE — Procedures (Signed)
Central Venous Catheter Insertion Procedure Note Tracy MorrowShirley A Mckee [id number] 1940/07/13  Procedure: Insertion of Central Venous Catheter Indications: Assessment of intravascular volume, Drug and/or fluid administration and Frequent blood sampling  Procedure Details Consent: Unable to obtain consent because of emergent medical necessity. Time Out: Verified patient identification, verified procedure, site/side was marked, verified correct patient position, special equipment/implants available, medications/allergies/relevent history reviewed, required imaging and test results available.  Performed  Maximum sterile technique was used including antiseptics, cap, gloves, gown, hand hygiene, mask and sheet. Skin prep: Chlorhexidine; local anesthetic administered A antimicrobial bonded/coated triple lumen catheter was placed in the left internal jugular vein using the Seldinger technique.  Evaluation Blood flow good Complications: No apparent complications Patient did tolerate procedure well. Chest X-ray ordered to verify placement.  CXR: pending.  Nelda BucksFEINSTEIN,Yosselyn Tax J. 01/22/2016, 8:42 AM  US Less 1 cc bl  Mcarthur Rossettianiel J. Tyson AliasFeinstein, MD, FACP Pgr: [phone] [facility]

## 2016-01-22 NOTE — Progress Notes (Signed)
Advanced Heart Failure Rounding Note   Subjective:    Admitted with CP. Troponin > 65. Had LHC with stent to LAD and had impella placed. Had acute respiratory distress in the cath lab that required urgent intubation. Also PEA with 5 minutes CPR.   Rewarming due to declining condition. Impella at P8. No urine output. Currently on epi 20, norepi 80 mcg, vasopressin and lidocaine drip.  On vent with full support. FiO2 100% PEEP 18.   CXR with diffuse infiltrates    Objective:   Weight Range:  Vital Signs:   Temp:  [88.5 F (31.4 C)-96.1 F (35.6 C)] 91 F (32.8 C) (08/07 0900) Pulse Rate:  [0-232] 88 (08/07 0900) Resp:  [0-71] 0 (08/07 0900) BP: (34-154)/(15-107) 71/67 (08/07 0733) SpO2:  [0 %-95 %] 81 % (08/07 0733) FiO2 (%):  [100 %] 100 % (08/07 0900) Weight:  [195 lb 8.8 oz (88.7 kg)-196 lb 3.4 oz (89 kg)] 196 lb 3.4 oz (89 kg) (08/07 0900)    Weight change: Filed Weights   01/22/16 0300 01/22/16 0900  Weight: 195 lb 8.8 oz (88.7 kg) 196 lb 3.4 oz (89 kg)    Intake/Output:   Intake/Output Summary (Last 24 hours) at 01/22/16 0926 Last data filed at 01/22/16 0900  Gross per 24 hour  Intake          1453.14 ml  Output              155 ml  Net          1298.14 ml     Physical Exam: General: Sedated on the vent.   HEENT: ETT in place Neck: supple.  Carotids 2+ bilat; no bruits. No lymphadenopathy or thryomegaly appreciated. Cor: PMI nondisplaced. Regular rate & rhythm. No rubs, gallops or murmurs. Lungs:Coarse throughout Abdomen: soft, nontender, nondistended. No hepatosplenomegaly. No bruits or masses. Good bowel sounds. Extremities: R groin Impella/ Swan/  R and LLE cool. Unable to palpate R pedal pulse Neuro: Sedated intubated Telemetry: Sinus Rhythm PVCs. 100s frequent ectopy  Labs: Basic Metabolic Panel:  Recent Labs Lab 01/18/2016 2232 01/22/16 0417 01/22/16 0429 01/22/16 0645 01/22/16 0730  NA 129* 140 138 140 137  K 3.1* 3.3* 3.3* 3.3* 3.2*    CL 94* 101 101 101 101  CO2 20*  --  13*  --  12*  GLUCOSE 282* 646* 693* 654* 708*  BUN 18 23* 17 27* 19  CREATININE 1.28* 1.50* 1.76* 1.40* 1.79*  CALCIUM 9.2  --  7.2*  --  7.2*  MG  --   --  1.9  --   --   PHOS  --   --  8.9*  --   --     Liver Function Tests: No results for input(s): AST, ALT, ALKPHOS, BILITOT, PROT, ALBUMIN in the last 168 hours. No results for input(s): LIPASE, AMYLASE in the last 168 hours. No results for input(s): AMMONIA in the last 168 hours.  CBC:  Recent Labs Lab 01/22/2016 2232 01/22/16 0417 01/22/16 0429 01/22/16 0645  WBC 12.3*  --  27.5*  --   HGB 15.6* 13.6 13.3 14.6  HCT 45.5 40.0 41.1 43.0  MCV 82.1  --  87.1  --   PLT 246  --  339  --     Cardiac Enzymes:  Recent Labs Lab 01/22/16 0429 01/22/16 0730  TROPONINI >65.00* >65.00*    BNP: BNP (last 3 results) No results for input(s): BNP in the last 8760 hours.  ProBNP (  last 3 results) No results for input(s): PROBNP in the last 8760 hours.    Other results:  Imaging: Dg Chest Port 1 View  Result Date: 01/22/2016 CLINICAL DATA:  Central line placement, diabetes mellitus, hypertension, GERD EXAM: PORTABLE CHEST 1 VIEW COMPARISON:  Portable exam 0844 hours compared to 0249 hours FINDINGS: Tip of endotracheal tube projects 2.8 cm above carina. Tip of infra diaphragmatic Swan-Ganz catheter projects over LEFT lower lobe pulmonary artery. Nasogastric tube extends into stomach. Impella device projects over ascending aorta. LEFT jugular central venous catheter tip projects over confluence of LEFT brachiocephalic vein and SVC. External pacing leads present. Stable heart size. Severe diffuse BILATERAL airspace infiltrates unchanged. No pneumothorax following jugular line insertion. IMPRESSION: No interval change. Electronically Signed   By: Ulyses SouthwardMark  Boles M.D.   On: 01/22/2016 09:02   Dg Chest Port 1 View  Result Date: 01/22/2016 CLINICAL DATA:  Acute respiratory failure. EXAM: PORTABLE CHEST  1 VIEW COMPARISON:  Most recent chest imaging 06/27/2011 FINDINGS: Endotracheal tube is 4.2 cm from the carina. Impella device projects over the aorta. Probable Swan-Ganz catheter from an inferior approach tip in the region of the left lower lobe pulmonary artery. Dense right lung consolidation with air bronchograms. Lesser findings on the left and perihilar region. The heart size and mediastinal contours are grossly normal. No evidence of pneumothorax or large pleural effusion. No acute displaced rib fracture. IMPRESSION: 1. Endotracheal tube 4.2 cm from the carina. Impella device in place. Probable left Swan-Ganz catheter from an inferior approach is in the region of the left lower lobe pulmonary artery, retraction of a few cm recommended for optimal placement. 2. Dense right perihilar opacity with air bronchograms, with similar findings on the left to lesser extent. Considerations include pulmonary edema, aspiration, pneumonia, or ARDS. Pulmonary edema is favored. Electronically Signed   By: Rubye OaksMelanie  Ehinger M.D.   On: 01/22/2016 03:12   Dg Abd Portable 1v  Result Date: 01/22/2016 CLINICAL DATA:  Orogastric tube placement.  Initial encounter. EXAM: PORTABLE ABDOMEN - 1 VIEW COMPARISON:  CT of the abdomen and pelvis from 06/11/2006 FINDINGS: An enteric tube is noted ending overlying the body of the stomach. The visualized bowel gas pattern is unremarkable. Scattered air and stool filled loops of colon are seen; no abnormal dilatation of small bowel loops is seen to suggest small bowel obstruction. No free intra-abdominal air is identified, though evaluation for free air is limited on a single supine view. The visualized osseous structures are within normal limits; the sacroiliac joints are unremarkable in appearance. Right basilar airspace opacity raises concern for pneumonia. External pacing pads are noted. IMPRESSION: 1. Enteric tube noted ending overlying the body of the stomach. 2. Unremarkable bowel gas  pattern; no free intra-abdominal air seen. 3. Minimal right basilar airspace opacity raises concern for pneumonia. Electronically Signed   By: Roanna RaiderJeffery  Chang M.D.   On: 01/22/2016 04:16      Medications:     Scheduled Medications: . artificial tears  1 application Both Eyes Q8H  . aspirin  81 mg Per Tube Daily  . famotidine (PEPCID) IV  20 mg Intravenous Q12H  . heparin      . insulin aspart  2-6 Units Subcutaneous Q4H  . midazolam      . sodium chloride flush  10-40 mL Intracatheter Q12H  . sodium chloride flush  3 mL Intravenous Q12H  . sodium chloride flush  3 mL Intravenous Q12H  . ticagrelor  90 mg Oral BID  Infusions: . sodium chloride 10 mL/hr at 01/22/16 0900  . amiodarone 30 mg/hr (01/22/16 0900)  . cisatracurium (NIMBEX) infusion 1.5 mcg/kg/min (01/22/16 0900)  . epinephrine 10 mcg/min (01/22/16 0900)  . fentaNYL infusion INTRAVENOUS 250 mcg/hr (01/22/16 0900)  . impella catheter heparin 50 unit/mL in dextrose 5% 50,000 Units (01/22/16 0411)  . heparin    . insulin (NOVOLIN-R) infusion 10.8 Units/hr (01/22/16 0905)  . insulin (NOVOLIN-R) infusion    . midazolam (VERSED) infusion 4 mg/hr (01/22/16 0900)  . norepinephrine (LEVOPHED) Adult infusion 80 mcg/min (01/22/16 0923)  .  sodium bicarbonate  infusion 1000 mL       PRN Medications:  sodium chloride, sodium chloride, acetaminophen, [COMPLETED] cisatracurium **AND** cisatracurium (NIMBEX) infusion **AND** cisatracurium, fentaNYL, midazolam, ondansetron (ZOFRAN) IV, sodium chloride flush, sodium chloride flush, sodium chloride flush   Assessment:  1. Cardiogenic Shock - Impella Placed 01/22/2016  2. Acute Respiratory Failure/ARDS - Intubated 8/72017  3. Acute Anterior STEMI- S/P Stent LAD  4. PEA 01/22/2016   5. Bilateral Infiltrates/ARDS- ? Aspiration.  6. AKI  7. Severe acidosis 8. Hyperglycemia 9. Hypokalemia/Hypocalcemia 10. Ventricular tachycardia  Plan/Discussion:    See below  Length of Stay:  0  Amy Clegg NP-C  01/22/2016, 9:26 AM  Advanced Heart Failure Team Pager [phone] (M-F; 7a - 4p)  Please contact [facility] for night-coverage after hours (4p -7a ) and weekends on [email or web address]  Patient seen and examined with Tonye Becket, NP. We discussed all aspects of the encounter. I agree with the assessment and plan as stated above.   Ms. Dail is critically ill with profound cardiogenic shock in the setting of a large anterior myocardial infarction. She intubated and has undergone Impella placement. She has severe acidosis, ARDS and is requiring high-dose pressors and bicarbonate drip to keep her alive.She is now anuric and having ongoing hemolysis through her Impella. She has also had VT and is now on a lidocaine drip. I have spent well over 2 hours today at her bedside personally directing her care and discussing with consultants and her family. I have also repositioned her Impella device several times under ultrasound guidance.   Despite aggressive therapy her prognosis remains extremely poor and her family is aware that there is a good chance that she may not survive the evening.  Total CCT = 2.5 hours.   Bensimhon, Daniel,MD 9:20 PM

## 2016-01-22 NOTE — Care Management Note (Addendum)
Case Management Note  Patient Details  Name: Modena MorrowShirley A Feger MRN: [id number] Date of Birth: [birth year]  Subjective/Objective:        Adm w stemi            Action/Plan: lives w husband,pcp dr Selena Battenkim   Expected Discharge Date:                  Expected Discharge Plan:     In-House Referral:     Discharge planning Services  CM Consult, Medication Assistance  Post Acute Care Choice:    Choice offered to:     DME Arranged:    DME Agency:     HH Arranged:    HH Agency:     Status of Service:     If discussed at [employer] Length of [employer] Meetings, dates discussed:    Additional Comments: left pt 30day free brilinta card. Pt on vent at present.S/W [employer] @ [employer] ADV. # [phone]   BRILINTA 90 MG BID ( 30 )  COVER- YES  CO-PAY- $ 45.00  TIER- 3 DRUG  PRIOR APPROVAL - NO  PHARMACY : [facility] AND [facility]   MAIL-ORDER 90 DAY SUPPLY $ 90.00   Hanley HaysDowell, Chau Savell T, RN 01/22/2016, 10:04 AM

## 2016-01-22 NOTE — Progress Notes (Signed)
CRITICAL VALUE ALERT  Critical value received:  Potassium 2.7 Glucose >700   Date of notification: 01/22/16  Time of notification:  1030  Critical value read back:Yes.    Nurse who received alert: Exie ParodySandra Shawnita Krizek RN  MD notified (1st page):  Dr. Gala RomneyBensimhon  Time of first page:  1030  3 runs of Potassium ordered. Pt on insulin gtt

## 2016-01-22 NOTE — Progress Notes (Signed)
Unable to doppler pedal, tibial, femoral, or radial pulsed. Carotid pulses were dopplered. MD made aware no changes.

## 2016-01-22 NOTE — Progress Notes (Signed)
Pharmacy Antibiotic Note  Tracy Mckee is a 75 y.o. female admitted on 02/12/2016 with cardiogenic shock s/p arrest in cath lab. Pharmacy has been consulted for Vancomycin and Zosyn dosing for suspected pneumonia. CXR with diffuse bilateral infiltrates - concern for aspiration pneumonia. Patient currently cooled with temp 33.2 C. WBC 27.5. Poor renal function and low urine output noted. Will start antibiotics once cultures drawn - nursing aware.   Plan: Vancomycin 1750 mg IV x1, then 1g IV every 24 hours. Goal trough 15-20 mcg/mL Zosyn 3.375 g IV (30 min infusion) x1, then 3.375 g IV every 8 hours (4 hour infusion) Monitor clinical picture, renal function, and culture results Follow-up vancomycin trough as needed   Height: 5\' 5"  (165.1 cm) Weight: 196 lb 3.4 oz (89 kg) IBW/kg (Calculated) : 57  Temp (24hrs), Avg:92.3 F (33.5 C), Min:88.5 F (31.4 C), Max:96.1 F (35.6 C)   Recent Labs Lab 01/25/2016 2232 01/22/16 0417 01/22/16 0428 01/22/16 0429 01/22/16 0645 01/22/16 0730 01/22/16 1027  WBC 12.3*  --   --  27.5*  --   --   --   CREATININE 1.28* 1.50*  --  1.76* 1.40* 1.79* 1.50*  LATICACIDVEN  --   --  15.4*  --   --  14.1*  --     Estimated Creatinine Clearance: 36.3 mL/min (by C-G formula based on SCr of 1.5 mg/dL).    Allergies  Allergen Reactions  . Baclofen   . Ciprofloxacin   . Hydrocodone-Acetaminophen Other (See Comments)    Patient states it made her feel like she was having a heart attack  . Other Swelling    Chest tightness-2 unknown antibiotics (One starts with a "B" and the other, a "C")    Antimicrobials this admission: 8/7 Vanc >>  8/7 Zosyn >>   Dose adjustments this admission: N/A  Microbiology results: 8/7 Blood Cx x2 - drawn  8/7 Resp Cx - drawn  MRSA PCR neg   Thank you for allowing pharmacy to be a part of this patient's care.  York CeriseKatherine Cook, PharmD Pharmacy Resident  Pager [phone] 01/22/16 10:42 AM

## 2016-01-22 NOTE — Progress Notes (Signed)
Initial Nutrition Assessment  DOCUMENTATION CODES:   Obesity unspecified  INTERVENTION:   Unable to feed at this time due to pressor needs Once more stable recommend initiating the adult tube feeding protocol Vital High Protein @ 40 ml/hr 30 ml Prostat BID Provides: 1160 kcal, 114 grams protein, and 802 ml H2O.   NUTRITION DIAGNOSIS:   Inadequate oral intake related to inability to eat as evidenced by NPO status.  GOAL:   Provide needs based on ASPEN/SCCM guidelines  MONITOR:   I & O's, Labs, Vent status  REASON FOR ASSESSMENT:   Ventilator    ASSESSMENT:   Pt with history of hypertension, diabetes and hyperlipidemia who had arthroscopic knee surgery on July 26 admitted with CP. Troponin > 65. Had [place] with stent to LAD and had impella placed. Had acute respiratory distress in the cath lab that required urgent intubation. Also PEA with 5 minutes CPR.     Patient is currently intubated on ventilator support Temp (24hrs), Avg:93.1 F (33.9 C), Min:88.5 F (31.4 C), Max:97.2 F (36.2 C)  Medications reviewed and include:  Amiodarone 30 mg (16.7 ml/hr) Epinephrine 20 mcg (75 ml/hr) Norepinephrine 100 mcg (93.8 ml/h) Vasopressin 11.3 ml/hr Nimbex  Labs reviewed: K+2.3, glucose 781 CBG's: >600  Unable to complete Nutrition-Focused physical exam at this time.  OG tip in stomach  Diet Order:     Skin:  Reviewed, no issues  Last BM:  unknown  Height:   Ht Readings from Last 1 Encounters:  01/22/16 5\' 5"  (1.651 m)    Weight:   Wt Readings from Last 1 Encounters:  01/22/16 196 lb 3.4 oz (89 kg)    Ideal Body Weight:  56.8 kg  BMI:  Body mass index is 32.65 kg/m.  Estimated Nutritional Needs:   Kcal:  [phone]  Protein:  >113 grams  Fluid:  > 1.5 L/day  EDUCATION NEEDS:   Education needs addressed  Kendell BaneHeather Sueanne Maniaci RD, LDN, CNSC [phone] Pager [phone] After Hours Pager

## 2016-01-22 NOTE — Progress Notes (Signed)
CRITICAL VALUE ALERT  Critical value received:  Lactic Acid 14.1  Date of notification:  01/22/16  Time of notification:  0745  Critical value read back:Yes.    Nurse who received alert:  Gaspar Garbeanya Shelton RN  MD notified (1st page):  Dr. Tyson AliasFeinstein  Time of first [phone]:[id number]  Blood Cultures ordered and drawn. ABX started

## 2016-01-22 NOTE — Progress Notes (Signed)
   01/22/16 0900  Clinical Encounter Type  Visited With Family  Visit Type Follow-up  Referral From Chaplain  Consult/Referral To Chaplain  Spiritual Encounters  Spiritual Needs Emotional;Prayer  CHP spoke with family in waiting area and offered emotional support, said she would be keeping family in prayers, and is available as needed. Rodney BoozeGail L Johnnell Liou 01/22/16

## 2016-01-22 NOTE — Progress Notes (Signed)
Began rewarming pt per Tyson AliasFeinstein at 0.5/hr to normothermia due to contraindications of cardiogentic shock. Total rewarm time should take 8 hours. 2 RNs present, Exie ParodySandra Merlini and Dub AmisSarah Jayana Kotula

## 2016-01-22 NOTE — Progress Notes (Signed)
Patient transported on vent from Cath Lab to 2H-04 without complications.

## 2016-01-22 NOTE — Care Management Important Message (Signed)
Important Message  Patient Details  Name: Tracy Mckee MRN: [id number] Date of Birth: [birth year]   Medicare Important Message Given:  Yes    Bernadette HoitShoffner, Scotti Kosta Coleman 01/22/2016, 8:30 AM

## 2016-01-22 NOTE — Progress Notes (Signed)
Pt CBG greater than 600. Orders placed to start insulin drip however pt does not have enough iv access. RN spoke with Marchelle Gearingamaswamy MD and was ordered to hold the Insulin infusion until CCM could place a central line after 0700am. Pt Bicarb infusion priority per MD.

## 2016-01-22 NOTE — Progress Notes (Signed)
Impella suction alarms MD made aware echo at bedside, Impella rep called. Impella repositioned by Dr. Gala RomneyBensimhon with Echo. Impella pulled back 2 cm to 84 from 86 cm.

## 2016-01-22 NOTE — Progress Notes (Signed)
Orthopedic Tech Progress Note Patient Details:  Tracy MorrowShirley A Mckee 17-Feb-1941 [id number]  Ortho Devices Type of Ortho Device: Knee Immobilizer   [name] 01/22/2016, 5:27 PM

## 2016-01-22 NOTE — Progress Notes (Signed)
Per glucostablizer order to increase insulin gtt to 54 units per hour. Guardrails hard stop at 50u/hr. MD aware, new orders replaced to restart insulin gtt with current cbg. Glucostablizer ordered to start insulin gtt at 5.4 Will continue to check blood sugars and monitor patient closely.

## 2016-01-22 NOTE — Progress Notes (Signed)
  Echocardiogram 2D Echocardiogram has been performed.  Delcie RochENNINGTON, Kaheem Halleck 01/22/2016, 11:11 AM

## 2016-01-22 NOTE — Progress Notes (Signed)
  Echocardiogram 2D Echocardiogram has been performed.  Tracy Mckee, Tracy Mckee 01/22/2016, 4:06 AM

## 2016-01-22 NOTE — Progress Notes (Signed)
   06/26/2015 1049  Clinical Encounter Type  Visited With Health care provider  Visit Type ED  Referral From Nurse  Chaplain responded to page for Code STEMI.  Patient taken immediately to Cath Lab.  No family present,  Chaplain waited to see if there was arrival of family.  No family arrived.

## 2016-01-22 NOTE — Progress Notes (Signed)
eLink Physician-Brief Progress Note Patient Name: Tracy MorrowShirley A Mckee DOB: 1941/05/17 MRN: [id number]    Recent Labs Lab 02-17-16 2232 01/22/16 0417  K 3.1* 3.3*    Recent Labs Lab 02-17-16 2232 01/22/16 0417  CREATININE 1.28* 1.50*     Estimated Creatinine Clearance: 36.2 mL/min (by C-G formula based on SCr of 1.5 mg/dL).  Plan 40meq kcl via tube   Intervention Category Intermediate Interventions: Electrolyte abnormality - evaluation and management  Osa Campoli 01/22/2016, 5:14 AM

## 2016-01-22 NOTE — Progress Notes (Addendum)
Critical ABG values given to Exie ParodySandra Merlini, RN.

## 2016-01-22 NOTE — Progress Notes (Signed)
MD repositioned Impella from 84 cm to 86cm with Echo. Redressed line will continue to assess and monitor the pt closely.

## 2016-01-22 NOTE — Progress Notes (Signed)
eLink Physician-Brief Progress Note Patient Name: Tracy MorrowShirley A Mckee DOB: 1940/09/07 MRN: [id number]   abg 6.99/41/46 (bic 10) -  On 100% fio2, peep 10, Vt 500 and RR 35  Plan Dc diprivan Start versed gtt Start bic gtt Increase peep to 12    Intervention Category Major Interventions: Other:  Brody Kump 01/22/2016, 4:54 AM

## 2016-01-22 NOTE — Progress Notes (Signed)
Called and spoke to nurse, RN stated that patient very unstable at this time and EEG not a priority at the moment. RN will call EEG when they want study completed

## 2016-01-22 NOTE — Progress Notes (Signed)
CRITICAL VALUE ALERT  Critical value received:  Potassium 2.3, Glucose 781  Date of notification:  01/22/2016  Time of notification:  1457  Critical value read back:Yes.    Nurse who received alert:  Merry LoftyBecky Yalena Colon  MD notified (1st page):  MD. Bensimhon  Orders for IV potassium in place, Insulin gtt titrated via glucostabilizer

## 2016-01-22 NOTE — Progress Notes (Signed)
CRITICAL VALUE ALERT  Critical value received:  potassium 2.0, calcium 6.3, glucose 740  Date of notification:  01/22/2015  Time of notification:  1805  Critical value read back:Yes.    Nurse who received alert:  Exie ParodySandra Merlini & Merry LoftyBecky Trayshawn Durkin  MD notified (1st page):  Dr. Gala RomneyBensimhon  Orders for IV potassium x6, 2 amps of calcium, and IV insulin titrated per glucostabilizer.

## 2016-01-23 ENCOUNTER — Inpatient Hospital Stay (HOSPITAL_COMMUNITY): Payer: PPO

## 2016-01-23 DIAGNOSIS — Z95811 Presence of heart assist device: Secondary | ICD-10-CM

## 2016-01-23 DIAGNOSIS — I213 ST elevation (STEMI) myocardial infarction of unspecified site: Secondary | ICD-10-CM

## 2016-01-23 DIAGNOSIS — J9600 Acute respiratory failure, unspecified whether with hypoxia or hypercapnia: Secondary | ICD-10-CM

## 2016-01-23 DIAGNOSIS — I209 Angina pectoris, unspecified: Secondary | ICD-10-CM

## 2016-01-23 DIAGNOSIS — N179 Acute kidney failure, unspecified: Secondary | ICD-10-CM

## 2016-01-23 LAB — CBC WITH DIFFERENTIAL/PLATELET
BASOS ABS: 0 10*3/uL (ref 0.0–0.1)
Basophils Relative: 0 %
EOS ABS: 0 10*3/uL (ref 0.0–0.7)
Eosinophils Relative: 0 %
HCT: 33.8 % — ABNORMAL LOW (ref 36.0–46.0)
Hemoglobin: 11.6 g/dL — ABNORMAL LOW (ref 12.0–15.0)
LYMPHS PCT: 7 %
Lymphs Abs: 2.4 10*3/uL (ref 0.7–4.0)
MCH: 28.4 pg (ref 26.0–34.0)
MCHC: 34.3 g/dL (ref 30.0–36.0)
MCV: 82.6 fL (ref 78.0–100.0)
Monocytes Absolute: 1.4 10*3/uL — ABNORMAL HIGH (ref 0.1–1.0)
Monocytes Relative: 4 %
NEUTROS ABS: 30.6 10*3/uL — AB (ref 1.7–7.7)
NEUTROS PCT: 89 %
PLATELETS: 184 10*3/uL (ref 150–400)
RBC: 4.09 MIL/uL (ref 3.87–5.11)
RDW: 13.4 % (ref 11.5–15.5)
WBC MORPHOLOGY: INCREASED
WBC: 34.4 10*3/uL — ABNORMAL HIGH (ref 4.0–10.5)

## 2016-01-23 LAB — BASIC METABOLIC PANEL
ANION GAP: 21 — AB (ref 5–15)
ANION GAP: 18 — AB (ref 5–15)
ANION GAP: 18 — AB (ref 5–15)
ANION GAP: 19 — AB (ref 5–15)
BUN: 24 mg/dL — ABNORMAL HIGH (ref 6–20)
BUN: 24 mg/dL — ABNORMAL HIGH (ref 6–20)
BUN: 24 mg/dL — ABNORMAL HIGH (ref 6–20)
BUN: 25 mg/dL — ABNORMAL HIGH (ref 6–20)
CALCIUM: 6.7 mg/dL — AB (ref 8.9–10.3)
CALCIUM: 6.1 mg/dL — AB (ref 8.9–10.3)
CALCIUM: 6.1 mg/dL — AB (ref 8.9–10.3)
CO2: 22 mmol/L (ref 22–32)
CO2: 24 mmol/L (ref 22–32)
CO2: 23 mmol/L (ref 22–32)
CO2: 23 mmol/L (ref 22–32)
Calcium: 6.3 mg/dL — CL (ref 8.9–10.3)
Chloride: 93 mmol/L — ABNORMAL LOW (ref 101–111)
Chloride: 90 mmol/L — ABNORMAL LOW (ref 101–111)
Chloride: 88 mmol/L — ABNORMAL LOW (ref 101–111)
Chloride: 86 mmol/L — ABNORMAL LOW (ref 101–111)
Creatinine, Ser: 2.66 mg/dL — ABNORMAL HIGH (ref 0.44–1.00)
Creatinine, Ser: 2.94 mg/dL — ABNORMAL HIGH (ref 0.44–1.00)
Creatinine, Ser: 3.11 mg/dL — ABNORMAL HIGH (ref 0.44–1.00)
Creatinine, Ser: 3.22 mg/dL — ABNORMAL HIGH (ref 0.44–1.00)
GFR calc Af Amer: 15 mL/min — ABNORMAL LOW (ref >60)
GFR, EST AFRICAN AMERICAN: 19 mL/min — AB (ref >60)
GFR, EST AFRICAN AMERICAN: 17 mL/min — AB (ref >60)
GFR, EST AFRICAN AMERICAN: 16 mL/min — AB (ref >60)
GFR, EST NON AFRICAN AMERICAN: 17 mL/min — AB (ref >60)
GFR, EST NON AFRICAN AMERICAN: 15 mL/min — AB (ref >60)
GFR, EST NON AFRICAN AMERICAN: 14 mL/min — AB (ref >60)
GFR, EST NON AFRICAN AMERICAN: 13 mL/min — AB (ref >60)
GLUCOSE: 426 mg/dL — AB (ref 65–99)
GLUCOSE: 254 mg/dL — AB (ref 65–99)
Glucose, Bld: 595 mg/dL (ref 65–99)
Glucose, Bld: 351 mg/dL — ABNORMAL HIGH (ref 65–99)
POTASSIUM: 2.7 mmol/L — AB (ref 3.5–5.1)
POTASSIUM: 4.1 mmol/L (ref 3.5–5.1)
POTASSIUM: 3.9 mmol/L (ref 3.5–5.1)
POTASSIUM: 3.9 mmol/L (ref 3.5–5.1)
SODIUM: 136 mmol/L (ref 135–145)
SODIUM: 132 mmol/L — AB (ref 135–145)
SODIUM: 129 mmol/L — AB (ref 135–145)
Sodium: 128 mmol/L — ABNORMAL LOW (ref 135–145)

## 2016-01-23 LAB — BLOOD GAS, ARTERIAL
ACID-BASE DEFICIT: 5.2 mmol/L — AB (ref 0.0–2.0)
Acid-base deficit: 0.6 mmol/L (ref 0.0–2.0)
Bicarbonate: 20.5 mEq/L (ref 20.0–24.0)
Bicarbonate: 24.3 mEq/L — ABNORMAL HIGH (ref 20.0–24.0)
Drawn by: 46203
Drawn by: 462031
FIO2: 1
FIO2: 1
LHR: 35 {breaths}/min
MECHVT: 500 mL
O2 Saturation: 96.6 %
O2 Saturation: 95.1 %
PCO2 ART: 45.7 mmHg — AB (ref 35.0–45.0)
PEEP: 18 cmH2O
PEEP: 18 cmH2O
PH ART: 7.274 — AB (ref 7.350–7.450)
Patient temperature: 98.6
Patient temperature: 98.6
RATE: 35 resp/min
TCO2: 21.9 mmol/L (ref 0–100)
TCO2: 25.6 mmol/L (ref 0–100)
VT: 500 mL
pCO2 arterial: 45.1 mmHg — ABNORMAL HIGH (ref 35.0–45.0)
pH, Arterial: 7.35 (ref 7.350–7.450)
pO2, Arterial: 90.4 mmHg (ref 80.0–100.0)
pO2, Arterial: 72.5 mmHg — ABNORMAL LOW (ref 80.0–100.0)

## 2016-01-23 LAB — GLUCOSE, CAPILLARY
GLUCOSE-CAPILLARY: 581 mg/dL — AB (ref 65–99)
GLUCOSE-CAPILLARY: 556 mg/dL — AB (ref 65–99)
GLUCOSE-CAPILLARY: 538 mg/dL — AB (ref 65–99)
GLUCOSE-CAPILLARY: 522 mg/dL — AB (ref 65–99)
GLUCOSE-CAPILLARY: 519 mg/dL — AB (ref 65–99)
GLUCOSE-CAPILLARY: 500 mg/dL — AB (ref 65–99)
GLUCOSE-CAPILLARY: 502 mg/dL — AB (ref 65–99)
GLUCOSE-CAPILLARY: 395 mg/dL — AB (ref 65–99)
GLUCOSE-CAPILLARY: 379 mg/dL — AB (ref 65–99)
GLUCOSE-CAPILLARY: 380 mg/dL — AB (ref 65–99)
GLUCOSE-CAPILLARY: 372 mg/dL — AB (ref 65–99)
GLUCOSE-CAPILLARY: 324 mg/dL — AB (ref 65–99)
Glucose-Capillary: 366 mg/dL — ABNORMAL HIGH (ref 65–99)
Glucose-Capillary: 414 mg/dL — ABNORMAL HIGH (ref 65–99)
Glucose-Capillary: 424 mg/dL — ABNORMAL HIGH (ref 65–99)
Glucose-Capillary: 437 mg/dL — ABNORMAL HIGH (ref 65–99)
Glucose-Capillary: 471 mg/dL — ABNORMAL HIGH (ref 65–99)
Glucose-Capillary: 514 mg/dL (ref 65–99)
Glucose-Capillary: 585 mg/dL (ref 65–99)
Glucose-Capillary: 599 mg/dL (ref 65–99)
Glucose-Capillary: >600 mg/dL (ref 65–99)

## 2016-01-23 LAB — POCT I-STAT 3, ART BLOOD GAS (G3+)
ACID-BASE DEFICIT: 2 mmol/L (ref 0.0–2.0)
Acid-base deficit: 3 mmol/L — ABNORMAL HIGH (ref 0.0–2.0)
Acid-base deficit: 3 mmol/L — ABNORMAL HIGH (ref 0.0–2.0)
BICARBONATE: 23.5 meq/L (ref 20.0–24.0)
Bicarbonate: 23.2 mEq/L (ref 20.0–24.0)
Bicarbonate: 23.8 mEq/L (ref 20.0–24.0)
O2 SAT: 91 %
O2 SAT: 89 %
O2 Saturation: 92 %
PCO2 ART: 44.4 mmHg (ref 35.0–45.0)
PH ART: 7.315 — AB (ref 7.350–7.450)
Patient temperature: 36.7
Patient temperature: 37.1
TCO2: 25 mmol/L (ref 0–100)
TCO2: 25 mmol/L (ref 0–100)
TCO2: 25 mmol/L (ref 0–100)
pCO2 arterial: 46.8 mmHg — ABNORMAL HIGH (ref 35.0–45.0)
pCO2 arterial: 44.6 mmHg (ref 35.0–45.0)
pH, Arterial: 7.324 — ABNORMAL LOW (ref 7.350–7.450)
pH, Arterial: 7.329 — ABNORMAL LOW (ref 7.350–7.450)
pO2, Arterial: 68 mmHg — ABNORMAL LOW (ref 80.0–100.0)
pO2, Arterial: 67 mmHg — ABNORMAL LOW (ref 80.0–100.0)
pO2, Arterial: 59 mmHg — ABNORMAL LOW (ref 80.0–100.0)

## 2016-01-23 LAB — BILIRUBIN, FRACTIONATED(TOT/DIR/INDIR)
Bilirubin, Direct: 1.4 mg/dL — ABNORMAL HIGH (ref 0.1–0.5)
Indirect Bilirubin: 0.7 mg/dL (ref 0.3–0.9)
Total Bilirubin: 2.1 mg/dL — ABNORMAL HIGH (ref 0.3–1.2)

## 2016-01-23 LAB — COMPREHENSIVE METABOLIC PANEL
ALK PHOS: 84 U/L (ref 38–126)
ALT: 335 U/L — ABNORMAL HIGH (ref 14–54)
ANION GAP: 20 — AB (ref 5–15)
AST: 1099 U/L — ABNORMAL HIGH (ref 15–41)
Albumin: 1.8 g/dL — ABNORMAL LOW (ref 3.5–5.0)
BILIRUBIN TOTAL: 1.8 mg/dL — AB (ref 0.3–1.2)
BUN: 24 mg/dL — ABNORMAL HIGH (ref 6–20)
CALCIUM: 6.5 mg/dL — AB (ref 8.9–10.3)
CO2: 23 mmol/L (ref 22–32)
Chloride: 91 mmol/L — ABNORMAL LOW (ref 101–111)
Creatinine, Ser: 2.81 mg/dL — ABNORMAL HIGH (ref 0.44–1.00)
GFR, EST AFRICAN AMERICAN: 18 mL/min — AB (ref >60)
GFR, EST NON AFRICAN AMERICAN: 16 mL/min — AB (ref >60)
GLUCOSE: 517 mg/dL — AB (ref 65–99)
Potassium: 3.3 mmol/L — ABNORMAL LOW (ref 3.5–5.1)
Sodium: 134 mmol/L — ABNORMAL LOW (ref 135–145)
TOTAL PROTEIN: 3.7 g/dL — AB (ref 6.5–8.1)

## 2016-01-23 LAB — CBC
HCT: 33.3 % — ABNORMAL LOW (ref 36.0–46.0)
HEMATOCRIT: 27.7 % — AB (ref 36.0–46.0)
HEMOGLOBIN: 9.7 g/dL — AB (ref 12.0–15.0)
Hemoglobin: 11.4 g/dL — ABNORMAL LOW (ref 12.0–15.0)
MCH: 28.5 pg (ref 26.0–34.0)
MCH: 28.6 pg (ref 26.0–34.0)
MCHC: 34.2 g/dL (ref 30.0–36.0)
MCHC: 35 g/dL (ref 30.0–36.0)
MCV: 83.3 fL (ref 78.0–100.0)
MCV: 81.7 fL (ref 78.0–100.0)
PLATELETS: 160 10*3/uL (ref 150–400)
Platelets: 121 10*3/uL — ABNORMAL LOW (ref 150–400)
RBC: 4 MIL/uL (ref 3.87–5.11)
RBC: 3.39 MIL/uL — ABNORMAL LOW (ref 3.87–5.11)
RDW: 13.6 % (ref 11.5–15.5)
RDW: 13.6 % (ref 11.5–15.5)
WBC: 34.2 10*3/uL — AB (ref 4.0–10.5)
WBC: 25.8 10*3/uL — AB (ref 4.0–10.5)

## 2016-01-23 LAB — RETICULOCYTES
RBC.: 4 MIL/uL (ref 3.87–5.11)
RETIC COUNT ABSOLUTE: 64 10*3/uL (ref 19.0–186.0)
RETIC CT PCT: 1.6 % (ref 0.4–3.1)

## 2016-01-23 LAB — POCT ACTIVATED CLOTTING TIME
ACTIVATED CLOTTING TIME: 186 s
ACTIVATED CLOTTING TIME: 186 s
ACTIVATED CLOTTING TIME: 186 s
ACTIVATED CLOTTING TIME: 186 s
ACTIVATED CLOTTING TIME: 186 s
ACTIVATED CLOTTING TIME: 186 s
ACTIVATED CLOTTING TIME: 186 s
ACTIVATED CLOTTING TIME: 186 s
ACTIVATED CLOTTING TIME: 186 s
Activated Clotting Time: 175 seconds
Activated Clotting Time: 186 seconds
Activated Clotting Time: 197 seconds
Activated Clotting Time: 180 seconds
Activated Clotting Time: 186 seconds
Activated Clotting Time: 191 seconds
Activated Clotting Time: 180 seconds
Activated Clotting Time: 186 seconds
Activated Clotting Time: 191 seconds
Activated Clotting Time: 191 seconds

## 2016-01-23 LAB — VANCOMYCIN, RANDOM: Vancomycin Rm: 15

## 2016-01-23 LAB — PROCALCITONIN: PROCALCITONIN: 94.34 ng/mL

## 2016-01-23 LAB — HEMOGLOBIN A1C
Hgb A1c MFr Bld: 7.2 % — ABNORMAL HIGH (ref 4.8–5.6)
MEAN PLASMA GLUCOSE: 160 mg/dL

## 2016-01-23 LAB — PROTIME-INR
INR: 2.58
Prothrombin Time: 28.2 seconds — ABNORMAL HIGH (ref 11.4–15.2)

## 2016-01-23 LAB — LACTATE DEHYDROGENASE
LDH: 2551 U/L — AB (ref 98–192)
LDH: 2666 U/L — AB (ref 98–192)

## 2016-01-23 MED ORDER — SODIUM BICARBONATE 8.4 % IV SOLN: 50.0000 meq | Freq: Once | INTRAVENOUS | Status: DC

## 2016-01-23 MED ORDER — SODIUM CHLORIDE 0.9 % IV SOLN
1.0000 g | Freq: Once | INTRAVENOUS | Status: AC
  Administered 2016-01-23: 1 g via INTRAVENOUS
  Filled 2016-01-23: qty 10

## 2016-01-23 MED ORDER — FUROSEMIDE 10 MG/ML IJ SOLN
160.0000 mg | Freq: Once | INTRAVENOUS | Status: AC
  Administered 2016-01-23: 160 mg via INTRAVENOUS
  Filled 2016-01-23: qty 16

## 2016-01-23 MED ORDER — PANTOPRAZOLE SODIUM 40 MG PO PACK: 40.0000 mg | PACK | Freq: Every day | ORAL | Status: DC

## 2016-01-23 MED ORDER — INSULIN REGULAR BOLUS VIA INFUSION
12.0000 [IU] | Freq: Once | INTRAVENOUS | Status: AC
  Administered 2016-01-23: 12 [IU] via INTRAVENOUS
  Filled 2016-01-23: qty 12

## 2016-01-23 MED ORDER — PANTOPRAZOLE SODIUM 40 MG IV SOLR
40.0000 mg | INTRAVENOUS | Status: DC
  Administered 2016-01-23: 40 mg via INTRAVENOUS
  Filled 2016-01-23: qty 40

## 2016-01-23 MED ORDER — POTASSIUM CHLORIDE 10 MEQ/50ML IV SOLN
10.0000 meq | INTRAVENOUS | Status: AC
  Administered 2016-01-23 (×5): 10 meq via INTRAVENOUS
  Filled 2016-01-23 (×5): qty 50

## 2016-01-23 MED ORDER — PIPERACILLIN-TAZOBACTAM IN DEX 2-0.25 GM/50ML IV SOLN
2.2500 g | Freq: Three times a day (TID) | INTRAVENOUS | Status: DC
  Administered 2016-01-23 (×2): 2.25 g via INTRAVENOUS
  Filled 2016-01-23 (×4): qty 50

## 2016-01-23 MED ORDER — FLUCONAZOLE IN SODIUM CHLORIDE 200-0.9 MG/100ML-% IV SOLN: 200.0000 mg | INTRAVENOUS | Status: DC

## 2016-01-23 MED ORDER — FLUCONAZOLE IN SODIUM CHLORIDE 400-0.9 MG/200ML-% IV SOLN
400.0000 mg | Freq: Once | INTRAVENOUS | Status: AC
  Administered 2016-01-23: 400 mg via INTRAVENOUS
  Filled 2016-01-23: qty 200

## 2016-01-23 MED ORDER — SODIUM BICARBONATE 8.4 % IV SOLN
50.0000 meq | Freq: Once | INTRAVENOUS | Status: AC
  Administered 2016-01-23: 50 meq via INTRAVENOUS

## 2016-01-23 MED FILL — Nitroglycerin IV Soln 100 MCG/ML in D5W: INTRA_ARTERIAL | Qty: 10 | Status: AC

## 2016-01-23 NOTE — Progress Notes (Signed)
eLink Physician-Brief Progress Note Patient Name: Tracy MorrowShirley A Mckee DOB: [birth year] MRN: [id number]   RN calling about latest abg result below   Recent Labs Lab 01/22/16 1023 01/22/16 1027 01/22/16 1310 01/22/16 1615 01/22/16 2027 01/22/16 2256  PHART 7.242*  --  7.206* 7.190* 7.245* 7.248*  PCO2ART 36.0  --  38.9 42.3 41.5 45.9*  PO2ART 138.0*  --  69.0* 89.9 94.1 108*  HCO3 16.2*  --  15.8* 15.7* 17.4* 19.4*  TCO2 18 18 17  17.0 18.7 20.8  O2SAT 99.0  --  92.0 96.2 96.9 97.8    A) acceptable pH and is permissive hypercapnia range  P No vent or bicarb gtt changes   Intervention Category Major Interventions: Acid-Base disturbance - evaluation and management  Ellenor Wisniewski 01/23/2016, 12:43 AM

## 2016-01-23 NOTE — Progress Notes (Signed)
Advanced Heart Failure Rounding Note   Subjective:    Admitted with CP. Troponin > 65. Had [place] with stent to LAD and had impella placed. Had acute respiratory distress in the cath lab that required urgent intubation. Also PEA with 5 minutes CPR.   Rewarming. Impella at P8. Minimal urine output Currently on epi 20, norepi 65 mcg, vasopressin, bicarb drip,  and lidocaine drip.  On vent with full support. FiO2 80% PEEP 18.   Swan Numbers CVP 12 PA 41/22 SVR 951 PCWP 15 CI 2.3   Sedated on the vent.   Objective:   Weight Range:  Vital Signs:   Temp:  [89.2 F (31.8 C)-97.3 F (36.3 C)] 96.8 F (36 C) (08/08 0715) Pulse Rate:  [82-106] 102 (08/08 0715) Resp:  [0-35] 35 (08/08 0715) BP: (71-110)/(65-77) 110/77 (08/07 1619) SpO2:  [71 %-99 %] 93 % (08/08 0715) FiO2 (%):  [80 %-100 %] 80 % (08/08 0715) Weight:  [196 lb 3.4 oz (89 kg)-223 lb 9.1 oz (101.4 kg)] 223 lb 9.1 oz (101.4 kg) (08/08 0500) Last BM Date:  (PTA)  Weight change: Filed Weights   01/22/16 0300 01/22/16 0900 01/23/16 0500  Weight: 195 lb 8.8 oz (88.7 kg) 196 lb 3.4 oz (89 kg) 223 lb 9.1 oz (101.4 kg)    Intake/Output:   Intake/Output Summary (Last 24 hours) at 01/23/16 0720 Last data filed at 01/23/16 0700  Gross per 24 hour  Intake         11004.46 ml  Output             1008 ml  Net          9996.46 ml     Physical Exam: CVP 8  General: Sedated on the vent.   HEENT: ETT in place Neck: supple.  Carotids 2+ bilat; no bruits. No lymphadenopathy or thryomegaly appreciated. Cor: PMI nondisplaced. Regular rate & rhythm. No rubs, gallops or murmurs. Lungs:Coarse throughout Abdomen: No bowel sounds. Nondistended. No hepatosplenomegaly. No bruits or masses.  Extremities: R groin Impella/ Swan/  R and LLE cool. Unable to palpate R and L  pedal pulse Neuro: Sedated intubated  Telemetry: Sinus Tach 100s  Labs: Basic Metabolic Panel:  Recent Labs Lab 01/22/16 0429  01/22/16 1138 01/22/16 1400  01/22/16 1806 01/22/16 2200 01/23/16 0200  NA 138  < > 142 138 139 136 136  K 3.3*  < > 2.5* 2.3* 2.0* 2.3* 2.7*  CL 101  < > 99* 97* 98* 95* 93*  CO2 13*  < > 16* 16* 19* 18* 22  GLUCOSE 693*  < > 755* 781* 740* 675* 595*  BUN 17  < > 21* 23* 22* 23* 24*  CREATININE 1.76*  < > 2.17* 2.28* 2.35* 2.53* 2.66*  CALCIUM 7.2*  < > 6.7* 6.7* 6.3* 7.0* 6.7*  MG 1.9  --   --   --   --   --   --   PHOS 8.9*  --   --   --   --   --   --   < > = values in this interval not displayed.  Liver Function Tests:  Recent Labs Lab 01/22/16 1138  AST 471*  ALT 228*  ALKPHOS 136*  BILITOT 1.4*  PROT 3.9*  ALBUMIN 1.9*   No results for input(s): LIPASE, AMYLASE in the last 168 hours. No results for input(s): AMMONIA in the last 168 hours.  CBC:  Recent Labs Lab 01/27/2016 2232 01/22/16 0417 01/22/16  6390 01/22/16 0645 01/22/16 1027 01/23/16 0615  WBC 12.3*  --  27.5*  --   --  34.4*  NEUTROABS  --   --   --   --   --  30.6*  HGB 15.6* 13.6 13.3 14.6 13.3 11.6*  HCT 45.5 40.0 41.1 43.0 39.0 33.8*  MCV 82.1  --  87.1  --   --  82.6  PLT 246  --  339  --   --  184    Cardiac Enzymes:  Recent Labs Lab 01/22/16 0429 01/22/16 0730 01/22/16 1309 01/22/16 2213  TROPONINI >65.00* >65.00* >65.00* >65.00*    BNP: BNP (last 3 results) No results for input(s): BNP in the last 8760 hours.  ProBNP (last 3 results) No results for input(s): PROBNP in the last 8760 hours.    Other results:  Imaging: Dg Chest Port 1 View  Result Date: 01/23/2016 CLINICAL DATA:  STEMI EXAM: PORTABLE CHEST 1 VIEW COMPARISON:  01/22/2016 FINDINGS: Endotracheal tube remains in good position. NG tube in the stomach. Swan-Ganz catheter from a femoral approach unchanged with the tip in the left lower lobe pulmonary artery. Diffuse bilateral airspace disease shows interval improvement but remains severe. Probable edema. IMPRESSION: Support lines remain in good position. Diffuse bilateral edema with interval  improvement. Electronically Signed   By: Marlan Palau M.D.   On: 01/23/2016 07:17   Dg Chest Port 1 View  Result Date: 01/22/2016 CLINICAL DATA:  Central line placement, diabetes mellitus, hypertension, GERD EXAM: PORTABLE CHEST 1 VIEW COMPARISON:  Portable exam 0844 hours compared to 0249 hours FINDINGS: Tip of endotracheal tube projects 2.8 cm above carina. Tip of infra diaphragmatic Swan-Ganz catheter projects over LEFT lower lobe pulmonary artery. Nasogastric tube extends into stomach. Impella device projects over ascending aorta. LEFT jugular central venous catheter tip projects over confluence of LEFT brachiocephalic vein and SVC. External pacing leads present. Stable heart size. Severe diffuse BILATERAL airspace infiltrates unchanged. No pneumothorax following jugular line insertion. IMPRESSION: No interval change. Electronically Signed   By: Ulyses Southward M.D.   On: 01/22/2016 09:02   Dg Chest Port 1 View  Result Date: 01/22/2016 CLINICAL DATA:  Acute respiratory failure. EXAM: PORTABLE CHEST 1 VIEW COMPARISON:  Most recent chest imaging 06/27/2011 FINDINGS: Endotracheal tube is 4.2 cm from the carina. Impella device projects over the aorta. Probable Swan-Ganz catheter from an inferior approach tip in the region of the left lower lobe pulmonary artery. Dense right lung consolidation with air bronchograms. Lesser findings on the left and perihilar region. The heart size and mediastinal contours are grossly normal. No evidence of pneumothorax or large pleural effusion. No acute displaced rib fracture. IMPRESSION: 1. Endotracheal tube 4.2 cm from the carina. Impella device in place. Probable left Swan-Ganz catheter from an inferior approach is in the region of the left lower lobe pulmonary artery, retraction of a few cm recommended for optimal placement. 2. Dense right perihilar opacity with air bronchograms, with similar findings on the left to lesser extent. Considerations include pulmonary edema,  aspiration, pneumonia, or ARDS. Pulmonary edema is favored. Electronically Signed   By: Rubye Oaks M.D.   On: 01/22/2016 03:12   Dg Abd Portable 1v  Result Date: 01/22/2016 CLINICAL DATA:  Orogastric tube placement.  Initial encounter. EXAM: PORTABLE ABDOMEN - 1 VIEW COMPARISON:  CT of the abdomen and pelvis from 06/11/2006 FINDINGS: An enteric tube is noted ending overlying the body of the stomach. The visualized bowel gas pattern is unremarkable. Scattered air and  stool filled loops of colon are seen; no abnormal dilatation of small bowel loops is seen to suggest small bowel obstruction. No free intra-abdominal air is identified, though evaluation for free air is limited on a single supine view. The visualized osseous structures are within normal limits; the sacroiliac joints are unremarkable in appearance. Right basilar airspace opacity raises concern for pneumonia. External pacing pads are noted. IMPRESSION: 1. Enteric tube noted ending overlying the body of the stomach. 2. Unremarkable bowel gas pattern; no free intra-abdominal air seen. 3. Minimal right basilar airspace opacity raises concern for pneumonia. Electronically Signed   By: Garald Balding M.D.   On: 01/22/2016 04:16     Medications:     Scheduled Medications: . antiseptic oral rinse  7 mL Mouth Rinse 10 times per day  . artificial tears  1 application Both Eyes A1P  . aspirin  81 mg Per Tube Daily  . chlorhexidine gluconate (SAGE KIT)  15 mL Mouth Rinse BID  . famotidine (PEPCID) IV  20 mg Intravenous Q24H  . piperacillin-tazobactam (ZOSYN)  IV  2.25 g Intravenous Q8H  . potassium chloride  10 mEq Intravenous Q1 Hr x 5  . sodium chloride flush  10-40 mL Intracatheter Q12H  . ticagrelor  90 mg Oral BID  . vancomycin  1,000 mg Intravenous Q24H    Infusions: . sodium chloride 10 mL/hr at 01/22/16 1900  . amiodarone Stopped (01/22/16 1730)  . cisatracurium (NIMBEX) infusion 1.5 mcg/kg/min (01/23/16 0126)  . epinephrine  20 mcg/min (01/23/16 0413)  . fentaNYL infusion INTRAVENOUS 250 mcg/hr (01/23/16 0126)  . impella catheter heparin 50 unit/mL in dextrose 5% 50,000 Units (01/22/16 0411)  . heparin    . insulin (NOVOLIN-R) infusion 8.8 Units/hr (01/23/16 0707)  . lidocaine 1 mg/min (01/22/16 1900)  . midazolam (VERSED) infusion 4 mg/hr (01/23/16 0417)  . norepinephrine (LEVOPHED) Adult infusion 65 mcg/min (01/23/16 0700)  .  sodium bicarbonate 150 mEq in sterile water 1000 mL infusion 150 mL/hr at 01/23/16 0023  . vasopressin (PITRESSIN) infusion - *FOR SHOCK* 0.03 Units/min (01/22/16 1900)    PRN Medications: acetaminophen, [COMPLETED] cisatracurium **AND** cisatracurium (NIMBEX) infusion **AND** cisatracurium, fentaNYL, midazolam, ondansetron (ZOFRAN) IV, sodium chloride flush   Assessment:  1. Cardiogenic Shock - Impella Placed 01/22/2016  2. Acute Respiratory Failure/ARDS - Intubated 8/72017  3. Acute Anterior STEMI- S/P Stent LAD  4. PEA 01/22/2016   5. Bilateral Infiltrates/ARDS- ? Aspiration.  6. AKI  7. Severe acidosis 8. Hyperglycemia 9. Hypokalemia/Hypocalcemia 10. Ventricular tachycardia  Plan/Discussion:    See below  Length of Stay: 1  Amy Clegg NP-C  01/23/2016, 7:20 AM  Advanced Heart Failure Team Pager [phone] (M-F; 7a - 4p)  Please contact [facility] for night-coverage after hours (4p -7a ) and weekends on [email or web address]  Patient seen and examined with Darrick Grinder, NP. We discussed all aspects of the encounter. I agree with the assessment and plan as stated above.   Swan numbers obtained personally. Impella adjusted under u/s guidance. She remains critically ill requiring max-dose triple pressor, Impella and ventilator support. She is anuric and is over 30 pounds up due to resuscitative efforts. Over night we made some progress with reduced pressor support and improvement in hemodynamics but throughout the day she became more hypotensive with low SVR despite triple pressors and  broad spectrum abx. We have added fungal coverage.   I have been in ann out of her room numerous times today and have had extensive discussions with family as well as  with CCM and Renal team. Prognosis is extremely we poor and we have reached the limits of our support for her. If she continues to lose ground will need to switch to comfort care. Family aware and agrees with approach.   Critical care time throughout the day 100 minutes.   Bensimhon, Daniel,MD 9:14 PM

## 2016-01-23 NOTE — Progress Notes (Signed)
RT called to pt's room, sat 87-88%. RT suctioned ETT but did not get anything. Peak pressure 35 and VT 492 remain stable with previous volumes. Pt's sat still remains at 88-89%. RN aware. ABG obtained and values reported by RN to MD.

## 2016-01-23 NOTE — Progress Notes (Signed)
EEG completed; results pending.    

## 2016-01-23 NOTE — Progress Notes (Signed)
ANTIBIOTIC CONSULT NOTE - INITIAL  Pharmacy Consult for fluconazole Indication: empirically for r/o candidemia  Allergies  Allergen Reactions  . Baclofen Other (See Comments)    UNKNOWN REACTION, per husband (patient is intubated)  . Ciprofloxacin Other (See Comments)    UNKNOWN REACTION, per husband (patient is intubated)  . Hydrocodone-Acetaminophen Other (See Comments)    Patient states it made her feel like she was having a heart attack  . Other Swelling and Other (See Comments)    Chest tightness-2 unknown antibiotics (One starts with a "B" and the other, a "C")    Patient Measurements: Height: _0  (165.1 cm) Weight: 223 lb 9.1 oz (101.4 kg) (103.8kg- 2.39kg) IBW/kg (Calculated) : 57 Adjusted Body Weight:   Vital Signs: Temp: 98.4 F (36.9 C) (08/08 1600) Temp Source: Core (Comment) (08/08 1600) BP: 85/51 (08/08 1603) Pulse Rate: 106 (08/08 1603) Intake/Output from previous day: 08/07 0701 - 08/08 0700 In: 11350.7 [I.V.:9170.8; IV Piggyback:1848] Out: 1008 [Urine:283; Emesis/NG output:725] Intake/Output from this shift: Total I/O In: 3380.2 [I.V.:2891.7; Other:127.5; NG/GT:45; IV Piggyback:316] Out: 0   Labs:  Recent Labs  01/22/16 0429  01/22/16 1027  01/23/16 0200 01/23/16 0615 01/23/16 0815 01/23/16 1141  WBC 27.5*  --   --   --   --  34.4* 34.2*  --   HGB 13.3  < > 13.3  --   --  11.6* 11.4*  --   PLT 339  --   --   --   --  184 160  --   CREATININE 1.76*  < > 1.50*  < > 2.66* 2.81*  --  2.94*  < > = values in this interval not displayed. Estimated Creatinine Clearance: 19.8 mL/min (by C-G formula based on SCr of 2.94 mg/dL).  Recent Labs  01/23/16 1146  VANCORANDOM 15     Microbiology: Recent Results (from the past 720 hour(s))  MRSA PCR Screening     Status: None   Collection Time: 01/22/16  5:42 AM  Result Value Ref Range Status   MRSA by PCR NEGATIVE NEGATIVE Final    Comment:        The GeneXpert MRSA Assay (FDA approved for NASAL  specimens only), is one component of a comprehensive MRSA colonization surveillance program. It is not intended to diagnose MRSA infection nor to guide or monitor treatment for MRSA infections.   Culture, blood (Routine X 2) w Reflex to ID Panel     Status: None (Preliminary result)   Collection Time: 01/22/16 10:18 AM  Result Value Ref Range Status   Specimen Description BLOOD LEFT HAND  Final   Special Requests IN PEDIATRIC BOTTLE 1CC  Final   Culture NO GROWTH 1 DAY  Final   Report Status PENDING  Incomplete  Culture, blood (Routine X 2) w Reflex to ID Panel     Status: None (Preliminary result)   Collection Time: 01/22/16 10:51 AM  Result Value Ref Range Status   Specimen Description BLOOD LEFT ARM  Final   Special Requests IN PEDIATRIC BOTTLE 3CC  Final   Culture NO GROWTH 1 DAY  Final   Report Status PENDING  Incomplete  Culture, respiratory (NON-Expectorated)     Status: None (Preliminary result)   Collection Time: 01/23/16 12:05 PM  Result Value Ref Range Status   Specimen Description TRACHEAL ASPIRATE  Final   Special Requests NONE  Final   Gram Stain   Final    MODERATE WBC PRESENT,BOTH PMN AND MONONUCLEAR NO ORGANISMS SEEN  Culture PENDING  Incomplete   Report Status PENDING  Incomplete    Medical History: Past Medical History:  Diagnosis Date  . Diabetes mellitus without complication ([place])   . Diverticulitis   . Edema   . GERD (gastroesophageal reflux disease)   . Hyperlipidemia   . Hypertension   . Osteoarthritis of both knees     Medications:  Scheduled:  . antiseptic oral rinse  7 mL Mouth Rinse 10 times per day  . artificial tears  1 application Both Eyes V3X  . aspirin  81 mg Per Tube Daily  . chlorhexidine gluconate (SAGE KIT)  15 mL Mouth Rinse BID  . [START ON 02/13/16] pantoprazole sodium  40 mg Per Tube Daily  . piperacillin-tazobactam (ZOSYN)  IV  2.25 g Intravenous Q8H  . sodium bicarbonate  50 mEq Intravenous Once  . sodium chloride  flush  10-40 mL Intracatheter Q12H  . ticagrelor  90 mg Oral BID  . vancomycin  1,000 mg Intravenous Q24H   Infusions:  . sodium chloride 10 mL/hr at 01/22/16 1900  . cisatracurium (NIMBEX) infusion 1.5 mcg/kg/min (01/23/16 0126)  . epinephrine 22 mcg/min (01/23/16 1600)  . fentaNYL infusion INTRAVENOUS 250 mcg/hr (01/23/16 1330)  . impella catheter heparin 50 unit/mL in dextrose 5% 50,000 Units (01/22/16 0411)  . heparin    . insulin (NOVOLIN-R) infusion 28.1 Units/hr (01/23/16 1625)  . lidocaine 1 mg/min (01/23/16 1400)  . midazolam (VERSED) infusion 2 mg/hr (01/23/16 1400)  . norepinephrine (LEVOPHED) Adult infusion 100 mcg/min (01/23/16 1400)  .  sodium bicarbonate 150 mEq in sterile water 1000 mL infusion 100 mL/hr at 01/23/16 1600  . vasopressin (PITRESSIN) infusion - *FOR SHOCK* 0.03 Units/min (01/22/16 1900)   Assessment: 75 yo female will be started on fluconazole empirically for r/o candidemia.  Patient has renal dysfunction with CrCl ~20  Goal of Therapy:  Resolution of infection  Plan:  Fluconazole 400 mg iv x1, then 200 mg iv q24h Monitor renal function and adjust dose accordingly  Whitman Meinhardt, Tsz-Yin 01/23/2016,5:00 PM

## 2016-01-23 NOTE — Consult Note (Addendum)
PULMONARY / CRITICAL CARE MEDICINE   Name: Tracy Mckee MRN: [id number] DOB: [birth year]    ADMISSION DATE:  22-Jan-2016 CONSULTATION DATE:  01/22/16  REFERRING MD:  Dr Donnie Aho, Dr Eldridge Dace  CHIEF COMPLAINT:  Cardiogenic shock and Acute respiratory failure  HISTORY OF PRESENT ILLNESS:   75 year old female has a history of hypertension and diabetes and hyperlipidemia.  She had arthroscopic knee surgery on July 26.  She developed significant substernal chest pain around 9 PM tonight and presented to [facility] emergency room where she was found to have what was felt to be an acute anterior infarction.  She had some ventricular tachycardia in the emergency room was started on intravenous amiodarone.  She has continued to have chest discomfort and was brought by EMS directly to the cath lab approx 11:45pm.  She continued to have chest pain and shortness of breath as well as diaphoresis at the time of presentation.  In the cath lab she was in shock while arterial access was being obtained. Experienced progressive hypoxemia and respiratory distress. She was intubated without sedation at approximately 00:30am. Subsequently experienced PEA and underwent 5 minutes CPR. Has intermittently required pushes of epi, was started on norepi gtt. 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 PTCI was performed. An impella device was placed. ABG reported to have pH 6.8 with a severe metabolic acidosis although not in the medical record yet. Perfusing pressure obtained but no meaningful neurological function at this time off sedation.   SUBJECTIVE:  Remains in refractory shock, peep 18, some improvement CI to 1.9 with impella Back to normothermia, temp at 36.2 Made 30 cc urine after lasix crt rise 10.2 liters pos   VITAL SIGNS: BP 110/77   Pulse (!) 102   Temp (!) 96.8 F (36 C)   Resp (!) 35   Ht  (1.651 m)   Wt 101.4 kg (223 lb 9.1 oz) Comment: 103.8kg- 2.39kg  SpO2 93%   BMI 37.20 kg/m    HEMODYNAMICS: PAP: (11-38)/(7-27) 36/24 CVP:  [0 mmHg-15 mmHg] 0 mmHg PCWP:  [9 mmHg-18 mmHg] 14 mmHg CO:  [2.6 L/min-3.9 L/min] 3.9 L/min CI:  [1.3 L/min/m2-2 L/min/m2] 2 L/min/m2  VENTILATOR SETTINGS: Vent Mode: PRVC FiO2 (%):  [80 %-100 %] 80 % Set Rate:  [35 bmp] 35 bmp Vt Set:  [500 mL] 500 mL PEEP:  [18 cmH20] 18 cmH20 Plateau Pressure:  [30 cmH20-35 cmH20] 34 cmH20  INTAKE / OUTPUT: I/O last 3 completed shifts: In: 11833.6 [I.V.:9442; Other:493.6; IV Piggyback:1898] Out: 1088 [Urine:363; Emesis/NG output:725]  PHYSICAL EXAMINATION: General:  Obese woman, unresponsive, intubated Neuro:  Paralyzed, deep rass assumed, per1-2 sluggish mm HEENT:  OP clear, ETT in place Cardiovascular:  s1 s2 distant, rr, no r Lungs:  reduced coarse Abdomen:  Obese, soft, hypoactive BS, pads Musculoskeletal:  Bruising R knee, R femoral PA-c in place Skin:  Cool and clammy  LABS:  BMET  Recent Labs Lab 01/22/16 1806 01/22/16 2200 01/23/16 0200  NA 139 136 136  K 2.0* 2.3* 2.7*  CL 98* 95* 93*  CO2 19* 18* 22  BUN 22* 23* 24*  CREATININE 2.35* 2.53* 2.66*  GLUCOSE 740* 675* 595*    Electrolytes  Recent Labs Lab 01/22/16 0429  01/22/16 1806 01/22/16 2200 01/23/16 0200  CALCIUM 7.2*  < > 6.3* 7.0* 6.7*  MG 1.9  --   --   --   --   PHOS 8.9*  --   --   --   --   < > =  values in this interval not displayed.  CBC  Recent Labs Lab 2016-01-22 2232  2016/01/23 0429 2016-01-23 0645 Jan 23, 2016 1027 01/23/16 0615  WBC 12.3*  --  27.5*  --   --  34.4*  HGB 15.6*  < > 13.3 14.6 13.3 11.6*  HCT 45.5  < > 41.1 43.0 39.0 33.8*  PLT 246  --  339  --   --  184  < > = values in this interval not displayed.  Coag's  Recent Labs Lab 01-23-2016 0429 23-Jan-2016 1138 01/23/16 0615  APTT 191* 126*  --   INR 4.74* 2.76 2.58    Sepsis Markers  Recent Labs Lab 01-23-2016 0428 01/23/2016 0730  LATICACIDVEN 15.4* 14.1*    ABG  Recent Labs Lab 2016/01/23 2027 Jan 23, 2016 2256  01/23/16 0200  PHART 7.245* 7.248* 7.274*  PCO2ART 41.5 45.9* 45.7*  PO2ART 94.1 108* 90.4    Liver Enzymes  Recent Labs Lab 01-23-2016 1138  AST 471*  ALT 228*  ALKPHOS 136*  BILITOT 1.4*  ALBUMIN 1.9*    Cardiac Enzymes  Recent Labs Lab 23-Jan-2016 0730 01-23-16 1309 01-23-2016 2213  TROPONINI >65.00* >65.00* >65.00*    Glucose  Recent Labs Lab 01/23/16 0112 01/23/16 0211 01/23/16 0309 01/23/16 0411 01/23/16 0509 01/23/16 0605  GLUCAP 556* 585* 538* 522* 519* 514*    Imaging Dg Chest Port 1 View  Result Date: 01/23/2016 CLINICAL DATA:  STEMI EXAM: PORTABLE CHEST 1 VIEW COMPARISON:  2016/01/23 FINDINGS: Endotracheal tube remains in good position. NG tube in the stomach. Swan-Ganz catheter from a femoral approach unchanged with the tip in the left lower lobe pulmonary artery. Diffuse bilateral airspace disease shows interval improvement but remains severe. Probable edema. IMPRESSION: Support lines remain in good position. Diffuse bilateral edema with interval improvement. Electronically Signed   By: Marlan Palau M.D.   On: 01/23/2016 07:17   Dg Chest Port 1 View  Result Date: 2016/01/23 CLINICAL DATA:  Central line placement, diabetes mellitus, hypertension, GERD EXAM: PORTABLE CHEST 1 VIEW COMPARISON:  Portable exam 0844 hours compared to 0249 hours FINDINGS: Tip of endotracheal tube projects 2.8 cm above carina. Tip of infra diaphragmatic Swan-Ganz catheter projects over LEFT lower lobe pulmonary artery. Nasogastric tube extends into stomach. Impella device projects over ascending aorta. LEFT jugular central venous catheter tip projects over confluence of LEFT brachiocephalic vein and SVC. External pacing leads present. Stable heart size. Severe diffuse BILATERAL airspace infiltrates unchanged. No pneumothorax following jugular line insertion. IMPRESSION: No interval change. Electronically Signed   By: Ulyses Southward M.D.   On: 01/23/2016 09:02     STUDIES:  TTE January 23, 2023 >>  Limited study to assess impella; distal inferior and apical   akinesis; anteroseptal dyskinesis; overall severely reduced LV   function; decreased RV function; impella in ascending aorta; 25%   catheter in RV. EEG 8/8 >>   CULTURES: Jan 23, 2023 sputum>>> 2023/01/23 BC>>>  ANTIBIOTICS: 01-23-2023 zosyn>>> 2023-01-23 vanc>>>  SIGNIFICANT EVENTS: 01-22-2023 cath - stent LAD Jan 22, 2023 coded Jan 23, 2023- imprella, refractory shock, acidosis 8/8- 10 liters up, low ouput, arf, peep 18 remains, levo and epi remain high dose  LINES/TUBES: PA-cath R femoral 01/23/2023 >>  Swan groin rt 8/6>>> impella rt 8/6>>> Left IJ 8/7Z>>> Radial A line 8/7>>>  ASSESSMENT / PLAN:  PULMONARY A: Acute hypoxemia respiratory failure due to B infiltrates, acute systolic CHF Dense infiltrate r/o aspiration Refractory acidosis ARDS likely P:   ABg reviewed, maintain current high MV Would dc bicarb with ph greater 7.25 Keep plat less 30  as able Would consider lowering Tv by 1 cc/kg if ph would allow Reluctant to lower Tv and Ph with such refractory shock pao2 90, take fio2 to 80%, repeat abg if to 70% would reduce peep to 15 as goal q6h abg to remain pcxr in am  Sat goal 90 NO role increase rr will harm, with risk autopeep severe -ptx, shock  CARDIOVASCULAR A:  Acute STEMI S/p PTCI 8/7 Cardiogenic shock Acute systolic CHF VT reported P:  Anticoag in purge Amiodarone off, lidocaine, get LFt in am  Ci noted, wedge noted Levophed to map goal, consider 60 epi drip, no role increase impella per  chf Cortisol adequate   RENAL A:   Acute renal failure, cardiogenic shock refractory LA Does NOT appear to be a candidate for CVVHD given MODS and pressor needs, would be unlikely to tolerate P:   Support MAP Bicarb see pulm, consider dc, per chf saline kvo K supp 2 runs bmet q4h  GASTROINTESTINAL A:   SUP Nutrition  At risk shock liver P:   Pepcid, change to ppi will not feed with current critical illness, levophed needs consider  trickle in am  LFT today  HEMATOLOGIC A:   Leukocytosis, anemia, r/o hemolysis (impella) Vs ischemic ext Drop in plat P:  Follow CBC Seriel  coagulapathy on own and heparin purge with impella Assess haptoglobin, ldh trend, bili, retic count  INFECTIOUS A:   Bad infiltrate rt, asp, recent nosocomial exposure P:   Continued zosyn, vanc, get level Remains culture neg Assess pct with nosocmial exposure and risk res organisms   ENDOCRINE A:   DM   R/o rel AI P:   insulin drip remains, will bolus further to get to goals Dc lantus while on insulin drip  NEUROLOGIC A:   Acute encephalopathy At risk anoxic brain injury Refractory shock P:   RASS goal: -5 while paralyzed Remain at normothermia Keep versed, fent and nimbex eeg place today Place BIS in hiopes to lower versed with LFT   FAMILY  - Updates: Updated family at bedside by me, need to discuss dnr, would not recommend acls of any kind, prognosis poor  - Inter-disciplinary family meet or Palliative Care meeting due by:  01/29/16  Ccm time 50 min   Mcarthur Rossettianiel J. Tyson AliasFeinstein, MD, FACP Pgr: [phone] [facility]

## 2016-01-23 NOTE — Consult Note (Signed)
[facility] Consult Note  Reason for Consult: AKI, anuria, possible CVVHD Referring Physician: Dr. Irish Lack HPI: Tracy Mckee is an 74 y.o. female with PMH of HTN, DM, HLD, OA, Diverticulitis admitted for cardiogenic shock. History obtained from chart review as patient intubated/sedated and no family present.  Patient underwent arthroscopic knee surgery on 7/26, followed up on 8/1 with ortho and doing well per note. She was given antibiotics (cipro, flagyl) for suspected diverticulitis in setting of LLQ pain and history of prior. She presented to [facility] ED on evening of 8/7 with substernal chest pain. Found to have acute anterior infarct with Vtach in the ED. Started on IV amiodarone. Taken to cath lab here and went into cardiogenic shock. Developed hypoxemia and respiratory distress and was intubated. She went into PEA and received 5 minutes ACLS. She was started on norepi gtt and underwent LHC which showed stenosed osteal circumflex and occluded mid-LAD. DES was placed in mid-LAD and Impella device placed during cath. ABG showed a pH of 6.8 with severe metabolic acidosis. She was started on Arctic sun cooling protocol.  Patient currently on norepinephrine and vasopressin. BPs at 90s/60s with MAP low 60s and CVP 12. An EEG was markedly abnormal due to diffuse background suppression and lack of EEG reactivity with noxious stimulation. Renal function prior to admission was normal. She is now anuric with SCr rising from 1.28 to 2.94. Nephrology consulted for possible CVVHD.   PMH:   Past Medical History:  Diagnosis Date  . Diabetes mellitus without complication ([place])   . Diverticulitis   . Edema   . GERD (gastroesophageal reflux disease)   . Hyperlipidemia   . Hypertension   . Osteoarthritis of both knees     PSH:   Past Surgical History:  Procedure Laterality Date  . CARDIAC CATHETERIZATION N/A 01/18/2016   Procedure: Left Heart Cath and Coronary Angiography;  Surgeon: Jettie Booze, MD;  Location: [place] [facility];  Service: Cardiovascular;  Laterality: N/A;  . CARDIAC CATHETERIZATION N/A 02/13/2016   Procedure: Coronary Stent Intervention;  Surgeon: Jettie Booze, MD;  Location: [place] [facility];  Service: Cardiovascular;  Laterality: N/A;  . CARDIAC CATHETERIZATION N/A 01/29/2016   Procedure: Ventricular Assist Device Insertion;  Surgeon: Jettie Booze, MD;  Location: [place] [facility];  Service: Cardiovascular;  Laterality: N/A;  . CARDIAC CATHETERIZATION N/A 02/06/2016   Procedure: Right Heart Cath;  Surgeon: Jettie Booze, MD;  Location: [place] [facility];  Service: Cardiovascular;  Laterality: N/A;  . ESOPHAGEAL DILATION    . KNEE ARTHROSCOPY WITH MEDIAL MENISECTOMY Right 01/10/2016   Procedure: KNEE ARTHROSCOPY WITH MEDIAL  AND LATERAL MENISECTOMY;  Surgeon: Carole Civil, MD;  Location: [facility];  Service: Orthopedics;  Laterality: Right;  . OVARIAN CYST REMOVAL      Allergies:  Allergies  Allergen Reactions  . Baclofen Other (See Comments)    UNKNOWN REACTION, per husband (patient is intubated)  . Ciprofloxacin Other (See Comments)    UNKNOWN REACTION, per husband (patient is intubated)  . Hydrocodone-Acetaminophen Other (See Comments)    Patient states it made her feel like she was having a heart attack  . Other Swelling and Other (See Comments)    Chest tightness-2 unknown antibiotics (One starts with a "B" and the other, a "C")    Medications:   Prior to Admission medications   Medication Sig Start Date End Date Taking? Authorizing Provider  aspirin EC 81 MG tablet Take 81 mg  by mouth daily.   Yes Historical Provider, MD  CINNAMON PO Take 1 g by mouth 2 (two) times daily.    Historical Provider, MD  ciprofloxacin (CIPRO) 500 MG tablet Take 1 tablet (500 mg total) by mouth 2 (two) times daily. 01/16/16   Carole Civil, MD  fluticasone (FLONASE) 50 MCG/ACT nasal spray Place 1-2 sprays into both nostrils daily.      Historical Provider, MD  glimepiride (AMARYL) 2 MG tablet Take 2 mg by mouth 2 (two) times daily.     Historical Provider, MD  metFORMIN (GLUCOPHAGE-XR) 500 MG 24 hr tablet Take 500 mg by mouth 2 (two) times daily.    Historical Provider, MD  metroNIDAZOLE (FLAGYL) 500 MG tablet Take 1 tablet (500 mg total) by mouth 3 (three) times daily. 01/16/16   Carole Civil, MD  Omega-3 Fatty Acids (FISH OIL) 1000 MG CAPS Take 1 capsule by mouth daily.    Historical Provider, MD  omeprazole (PRILOSEC) 40 MG capsule Take 40 mg by mouth daily.    Historical Provider, MD  traMADol (ULTRAM) 50 MG tablet Take 1 tablet (50 mg total) by mouth every 6 (six) hours as needed. Patient taking differently: Take 50 mg by mouth every 6 (six) hours as needed for moderate pain.  11/22/15   Sanjuana Kava, MD  triamterene-hydrochlorothiazide (MAXZIDE-25) 37.5-25 MG per tablet Take 1 tablet by mouth daily.    Historical Provider, MD    Discontinued Meds:   Medications Discontinued During This Encounter  Medication Reason  . furosemide (LASIX) injection Patient Discharge  . adenosine (ADENOCARD) 6 MG/2ML injection Patient Discharge  . lidocaine (PF) (XYLOCAINE) 1 % injection Patient Discharge  . cangrelor [facility]) 50,000 mcg in sodium chloride 0.9 % 250 mL (200 mcg/mL) infusion Patient Discharge  . cangrelor [facility]) bolus via infusion Patient Discharge  . sodium bicarbonate injection Patient Discharge  . bivalirudin (ANGIOMAX) 250 mg in sodium chloride 0.9 % 50 mL (5 mg/mL) infusion Patient Discharge  . EPINEPHrine (ADRENALIN) 0.1 MG/ML injection Patient Discharge  . norepinephrine (LEVOPHED) 4 mg in dextrose 5 % 250 mL (0.016 mg/mL) infusion Patient Discharge  . adenosine (ADENOSCAN) for intracoronary use Patient Discharge  . bivalirudin (ANGIOMAX) BOLUS via infusion Patient Discharge  . norepinephrine (LEVOPHED) 4 mg in dextrose 5 % 250 mL (0.016 mg/mL) infusion   . aspirin chewable tablet 81 mg   . amiodarone  (NEXTERONE PREMIX) 360-4.14 MG/200ML-% (1.8 mg/mL) IV infusion   . amiodarone (NEXTERONE PREMIX) 360-4.14 MG/200ML-% (1.8 mg/mL) IV infusion   . amiodarone (NEXTERONE PREMIX) 360-4.14 MG/200ML-% (1.8 mg/mL) IV infusion   . ticagrelor (BRILINTA) tablet 90 mg   . amiodarone (NEXTERONE PREMIX) 360-4.14 MG/200ML-% (1.8 mg/mL) IV infusion   . heparin infusion 2 units/mL in 0.9 % sodium chloride Patient Discharge  . lidocaine (PF) (XYLOCAINE) 1 % injection Patient Discharge  . iopamidol (ISOVUE-370) 76 % injection Patient Discharge  . propofol (DIPRIVAN) 1000 MG/100ML infusion   . sodium bicarbonate 150 mEq in dextrose 5 % 1,000 mL infusion   . famotidine (PEPCID) IVPB 20 mg premix   . vancomycin (VANCOCIN) IVPB 1000 mg/200 mL premix   . lidocaine (XYLOCAINE) 4 mg/mL bolus via infusion 100 mg   . milrinone (PRIMACOR) 20 MG/100 ML (0.2 mg/mL) infusion   . sodium chloride flush (NS) 0.9 % injection 3 mL   . sodium chloride flush (NS) 0.9 % injection 3 mL   . 0.9 %  sodium chloride infusion   . sodium chloride flush (NS) 0.9 %  injection 3 mL   . sodium chloride flush (NS) 0.9 % injection 3 mL   . 0.9 %  sodium chloride infusion   . insulin aspart (novoLOG) injection 2-6 Units   . insulin regular (NOVOLIN R,HUMULIN R) 250 Units in sodium chloride 0.9 % 250 mL (1 Units/mL) infusion   . sodium bicarbonate 150 mEq in dextrose 5 % 1,000 mL infusion   . calcium gluconate inj 10% (1 g) URGENT USE ONLY!   Marland Kitchen potassium chloride 10 mEq in 50 mL *CENTRAL LINE* IVPB   . piperacillin-tazobactam (ZOSYN) IVPB 3.375 g   . amiodarone (NEXTERONE PREMIX) 360-4.14 MG/200ML-% (1.8 mg/mL) IV infusion   . famotidine (PEPCID) IVPB 20 mg premix   . acetaminophen (TYLENOL) tablet 650 mg   . pantoprazole (PROTONIX) injection 40 mg       Family History:   Family History  Problem Relation Age of Onset  . Breast cancer Mother     Social History:  reports that she has never smoked. She has never used smokeless  tobacco. She reports that she drinks alcohol. She reports that she does not use drugs. Review of Systems  Unable to perform ROS: Intubated    Blood pressure (!) 85/51, pulse (!) 106, temperature 98.4 F (36.9 C), temperature source Core (Comment), resp. rate (!) 35, height '5\' 5"'$  (1.651 m), weight 223 lb 9.1 oz (101.4 kg), SpO2 100 %.  Physical exam Limited due to patient paralyzed/sedated and intubated.  Physical Exam  Constitutional:  Obese woman, intubated, not responsive  HENT:  ETT in place. LIJ line  Eyes: Pupils are equal, round, and reactive to light.  Cardiovascular: Regular rhythm.  Tachycardia present.   Respiratory:  Vent supported breath sounds, coarse  GI:  Obese abdomen, hypoactive sounds, pads on  Musculoskeletal:  No pitting edema lower extremities or dependent thighs  Neurological:  Paralyzed, sedated  Skin:  Cool extremities    Creatinine, Ser  Date/Time Value Ref Range Status  01/23/2016 11:41 AM 2.94 (H) 0.44 - 1.00 mg/dL Final  01/23/2016 06:15 AM 2.81 (H) 0.44 - 1.00 mg/dL Final  01/23/2016 02:00 AM 2.66 (H) 0.44 - 1.00 mg/dL Final  01/22/2016 10:00 PM 2.53 (H) 0.44 - 1.00 mg/dL Final  01/22/2016 06:06 PM 2.35 (H) 0.44 - 1.00 mg/dL Final  01/22/2016 02:00 PM 2.28 (H) 0.44 - 1.00 mg/dL Final  01/22/2016 11:38 AM 2.17 (H) 0.44 - 1.00 mg/dL Final  01/22/2016 10:27 AM 1.50 (H) 0.44 - 1.00 mg/dL Final  01/22/2016 07:30 AM 1.79 (H) 0.44 - 1.00 mg/dL Final  01/22/2016 06:45 AM 1.40 (H) 0.44 - 1.00 mg/dL Final  01/22/2016 04:29 AM 1.76 (H) 0.44 - 1.00 mg/dL Final  01/22/2016 04:17 AM 1.50 (H) 0.44 - 1.00 mg/dL Final  01/29/2016 10:32 PM 1.28 (H) 0.44 - 1.00 mg/dL Final  01/08/2016 03:00 PM 0.88 0.44 - 1.00 mg/dL Final    Results for orders placed or performed during the hospital encounter of 01/19/2016 (from the past 48 hour(s))  Basic metabolic panel     Status: Abnormal   Collection Time: 02/09/2016 10:32 PM  Result Value Ref Range   Sodium 129 (L) 135 -  145 mmol/L   Potassium 3.1 (L) 3.5 - 5.1 mmol/L   Chloride 94 (L) 101 - 111 mmol/L   CO2 20 (L) 22 - 32 mmol/L   Glucose, Bld 282 (H) 65 - 99 mg/dL   BUN 18 6 - 20 mg/dL   Creatinine, Ser 1.28 (H) 0.44 - 1.00 mg/dL   Calcium  9.2 8.9 - 10.3 mg/dL   GFR calc non Af Amer 40 (L) >60 mL/min   GFR calc Af Amer 47 (L) >60 mL/min    Comment: (NOTE) The eGFR has been calculated using the CKD EPI equation. This calculation has not been validated in all clinical situations. eGFR's persistently <60 mL/min signify possible Chronic Kidney Disease.    Anion gap 15 5 - 15  CBC     Status: Abnormal   Collection Time: 01/19/2016 10:32 PM  Result Value Ref Range   WBC 12.3 (H) 4.0 - 10.5 K/uL   RBC 5.54 (H) 3.87 - 5.11 MIL/uL   Hemoglobin 15.6 (H) 12.0 - 15.0 g/dL   HCT 07.8 95.0 - 11.5 %   MCV 82.1 78.0 - 100.0 fL   MCH 28.2 26.0 - 34.0 pg   MCHC 34.3 30.0 - 36.0 g/dL   RDW 67.1 64.0 - 89.0 %   Platelets 246 150 - 400 K/uL  I-stat troponin, ED     Status: Abnormal   Collection Time: 01/25/2016 10:37 PM  Result Value Ref Range   Troponin i, poc 0.14 (HH) 0.00 - 0.08 ng/mL   Comment NOTIFIED PHYSICIAN    Comment 3            Comment: Due to the release kinetics of cTnI, a negative result within the first hours of the onset of symptoms does not rule out myocardial infarction with certainty. If myocardial infarction is still suspected, repeat the test at appropriate intervals.   POCT Activated clotting time     Status: None   Collection Time: 01/22/16 12:14 AM  Result Value Ref Range   Activated Clotting Time 444 seconds  I-STAT 3, arterial blood gas (G3+)     Status: Abnormal   Collection Time: 01/22/16  1:00 AM  Result Value Ref Range   pH, Arterial 7.192 (LL) 7.350 - 7.450   pCO2 arterial 85.4 (HH) 35.0 - 45.0 mmHg   pO2, Arterial 45.0 (L) 80.0 - 100.0 mmHg   Bicarbonate 32.8 (H) 20.0 - 24.0 mEq/L   TCO2 35 0 - 100 mmol/L   O2 Saturation 67.0 %   Acid-Base Excess 3.0 (H) 0.0 - 2.0  mmol/L   Patient temperature 98.6 F    Collection site ARTERIAL LINE    Drawn by Nurse    Sample type ARTERIAL    Comment NOTIFIED PHYSICIAN   I-STAT 3, arterial blood gas (G3+)     Status: Abnormal   Collection Time: 01/22/16  1:32 AM  Result Value Ref Range   pH, Arterial 6.874 (LL) 7.350 - 7.450   pCO2 arterial 44.3 35.0 - 45.0 mmHg   pO2, Arterial 69.0 (L) 80.0 - 100.0 mmHg   Bicarbonate 8.2 (L) 20.0 - 24.0 mEq/L   TCO2 9 0 - 100 mmol/L   O2 Saturation 75.0 %   Acid-base deficit 25.0 (H) 0.0 - 2.0 mmol/L   Patient temperature 98.6 F    Collection site ARTERIAL LINE    Drawn by Nurse    Sample type ARTERIAL    Comment NOTIFIED PHYSICIAN   I-STAT 3, venous blood gas (G3P V)     Status: Abnormal   Collection Time: 01/22/16  2:07 AM  Result Value Ref Range   pH, Ven 6.953 (LL) 7.250 - 7.300   pCO2, Ven 63.5 (H) 45.0 - 50.0 mmHg   pO2, Ven 26.0 (L) 31.0 - 45.0 mmHg   Bicarbonate 14.0 (L) 20.0 - 24.0 mEq/L   TCO2 16 0 -  100 mmol/L   O2 Saturation 23.0 %   Acid-base deficit 18.0 (H) 0.0 - 2.0 mmol/L   Patient temperature HIDE    Sample type VENOUS    Comment NOTIFIED PHYSICIAN   Glucose, capillary     Status: Abnormal   Collection Time: 01/22/16  4:15 AM  Result Value Ref Range   Glucose-Capillary >600 (HH) 65 - 99 mg/dL  I-STAT, chem 8     Status: Abnormal   Collection Time: 01/22/16  4:17 AM  Result Value Ref Range   Sodium 140 135 - 145 mmol/L   Potassium 3.3 (L) 3.5 - 5.1 mmol/L   Chloride 101 101 - 111 mmol/L   BUN 23 (H) 6 - 20 mg/dL   Creatinine, Ser 1.50 (H) 0.44 - 1.00 mg/dL   Glucose, Bld 646 (HH) 65 - 99 mg/dL   Calcium, Ion 0.99 (L) 1.12 - 1.23 mmol/L   TCO2 17 0 - 100 mmol/L   Hemoglobin 13.6 12.0 - 15.0 g/dL   HCT 40.0 36.0 - 46.0 %  Lactic acid, plasma     Status: Abnormal   Collection Time: 01/22/16  4:28 AM  Result Value Ref Range   Lactic Acid, Venous 15.4 (HH) 0.5 - 1.9 mmol/L    Comment: RESULTS CONFIRMED BY MANUAL DILUTION CRITICAL RESULT  CALLED TO, READ BACK BY AND VERIFIED WITH: QUICK K,RN 01/22/16 0520 WAYK   Troponin I     Status: Abnormal   Collection Time: 01/22/16  4:29 AM  Result Value Ref Range   Troponin I >65.00 (HH) <0.03 ng/mL    Comment: CRITICAL RESULT CALLED TO, READ BACK BY AND VERIFIED WITH: QUICK K,RN 01/22/16 0536 WAYK   Basic metabolic panel     Status: Abnormal   Collection Time: 01/22/16  4:29 AM  Result Value Ref Range   Sodium 138 135 - 145 mmol/L   Potassium 3.3 (L) 3.5 - 5.1 mmol/L    Comment: SPECIMEN HEMOLYZED. HEMOLYSIS MAY AFFECT INTEGRITY OF RESULTS.   Chloride 101 101 - 111 mmol/L   CO2 13 (L) 22 - 32 mmol/L   Glucose, Bld 693 (HH) 65 - 99 mg/dL    Comment: CRITICAL RESULT CALLED TO, READ BACK BY AND VERIFIED WITH: QUICK K,RN 01/22/16 0526 WAYK    BUN 17 6 - 20 mg/dL   Creatinine, Ser 1.76 (H) 0.44 - 1.00 mg/dL   Calcium 7.2 (L) 8.9 - 10.3 mg/dL   GFR calc non Af Amer 27 (L) >60 mL/min   GFR calc Af Amer 32 (L) >60 mL/min    Comment: (NOTE) The eGFR has been calculated using the CKD EPI equation. This calculation has not been validated in all clinical situations. eGFR's persistently <60 mL/min signify possible Chronic Kidney Disease.    Anion gap 24 (H) 5 - 15  Protime-INR now and repeat in 8 hours     Status: Abnormal   Collection Time: 01/22/16  4:29 AM  Result Value Ref Range   Prothrombin Time 45.8 (H) 11.4 - 15.2 seconds   INR 4.74 (HH)     Comment: REPEATED TO VERIFY CRITICAL RESULT CALLED TO, READ BACK BY AND VERIFIED WITH: T.QUICK,RN 2831 01/22/16 M.CAMPBELL   APTT now and repeat in 8 hours     Status: Abnormal   Collection Time: 01/22/16  4:29 AM  Result Value Ref Range   aPTT 191 (HH) 24 - 36 seconds    Comment:        IF BASELINE aPTT IS ELEVATED, SUGGEST PATIENT RISK ASSESSMENT  BE USED TO DETERMINE APPROPRIATE ANTICOAGULANT THERAPY. REPEATED TO VERIFY CRITICAL RESULT CALLED TO, READ BACK BY AND VERIFIED WITH: T.QUICK,RN 0540 01/22/16 M.CAMPBELL   CBC      Status: Abnormal   Collection Time: 01/22/16  4:29 AM  Result Value Ref Range   WBC 27.5 (H) 4.0 - 10.5 K/uL   RBC 4.72 3.87 - 5.11 MIL/uL   Hemoglobin 13.3 12.0 - 15.0 g/dL   HCT 41.1 36.0 - 46.0 %   MCV 87.1 78.0 - 100.0 fL   MCH 28.2 26.0 - 34.0 pg   MCHC 32.4 30.0 - 36.0 g/dL   RDW 13.0 11.5 - 15.5 %   Platelets 339 150 - 400 K/uL  Magnesium     Status: None   Collection Time: 01/22/16  4:29 AM  Result Value Ref Range   Magnesium 1.9 1.7 - 2.4 mg/dL  Phosphorus     Status: Abnormal   Collection Time: 01/22/16  4:29 AM  Result Value Ref Range   Phosphorus 8.9 (H) 2.5 - 4.6 mg/dL  Urinalysis, Routine w reflex microscopic (not at [facility])     Status: Abnormal   Collection Time: 01/22/16  4:30 AM  Result Value Ref Range   Color, Urine YELLOW YELLOW   APPearance TURBID (A) CLEAR   Specific Gravity, Urine 1.031 (H) 1.005 - 1.030   pH 5.0 5.0 - 8.0   Glucose, UA 500 (A) NEGATIVE mg/dL   Hgb urine dipstick LARGE (A) NEGATIVE   Bilirubin Urine NEGATIVE NEGATIVE   Ketones, ur NEGATIVE NEGATIVE mg/dL   Protein, ur 100 (A) NEGATIVE mg/dL   Nitrite NEGATIVE NEGATIVE   Leukocytes, UA NEGATIVE NEGATIVE  Urine microscopic-add on     Status: Abnormal   Collection Time: 01/22/16  4:30 AM  Result Value Ref Range   Squamous Epithelial / LPF 0-5 (A) NONE SEEN   WBC, UA TOO NUMEROUS TO COUNT 0 - 5 WBC/hpf   RBC / HPF 0-5 0 - 5 RBC/hpf   Bacteria, UA MANY (A) NONE SEEN   Urine-Other AMORPHOUS URATES/PHOSPHATES   Glucose, capillary     Status: Abnormal   Collection Time: 01/22/16  4:41 AM  Result Value Ref Range   Glucose-Capillary >600 (HH) 65 - 99 mg/dL  POCT Activated clotting time     Status: None   Collection Time: 01/22/16  4:42 AM  Result Value Ref Range   Activated Clotting Time 301 seconds  I-STAT 3, arterial blood gas (G3+)     Status: Abnormal   Collection Time: 01/22/16  4:51 AM  Result Value Ref Range   pH, Arterial 6.997 (LL) 7.350 - 7.450   pCO2 arterial 40.7 35.0 -  45.0 mmHg   pO2, Arterial 46.0 (L) 80.0 - 100.0 mmHg   Bicarbonate 10.3 (L) 20.0 - 24.0 mEq/L   TCO2 12 0 - 100 mmol/L   O2 Saturation 67.0 %   Acid-base deficit 21.0 (H) 0.0 - 2.0 mmol/L   Patient temperature 34.7 C    Collection site RADIAL, ALLEN'S TEST ACCEPTABLE    Drawn by RT    Sample type ARTERIAL    Comment NOTIFIED PHYSICIAN   Glucose, capillary     Status: Abnormal   Collection Time: 01/22/16  5:21 AM  Result Value Ref Range   Glucose-Capillary >600 (HH) 65 - 99 mg/dL   Comment 1 Arterial Specimen   POCT Activated clotting time     Status: None   Collection Time: 01/22/16  5:24 AM  Result Value Ref Range  Activated Clotting Time 296 seconds  MRSA PCR Screening     Status: None   Collection Time: 01/22/16  5:42 AM  Result Value Ref Range   MRSA by PCR NEGATIVE NEGATIVE    Comment:        The GeneXpert MRSA Assay (FDA approved for NASAL specimens only), is one component of a comprehensive MRSA colonization surveillance program. It is not intended to diagnose MRSA infection nor to guide or monitor treatment for MRSA infections.   POCT Activated clotting time     Status: None   Collection Time: 01/22/16  6:28 AM  Result Value Ref Range   Activated Clotting Time 285 seconds  I-STAT, chem 8     Status: Abnormal   Collection Time: 01/22/16  6:45 AM  Result Value Ref Range   Sodium 140 135 - 145 mmol/L   Potassium 3.3 (L) 3.5 - 5.1 mmol/L   Chloride 101 101 - 111 mmol/L   BUN 27 (H) 6 - 20 mg/dL   Creatinine, Ser 1.40 (H) 0.44 - 1.00 mg/dL   Glucose, Bld 654 (HH) 65 - 99 mg/dL   Calcium, Ion 0.97 (L) 1.12 - 1.23 mmol/L   TCO2 14 0 - 100 mmol/L   Hemoglobin 14.6 12.0 - 15.0 g/dL   HCT 43.0 36.0 - 46.0 %  Troponin I     Status: Abnormal   Collection Time: 01/22/16  7:30 AM  Result Value Ref Range   Troponin I >65.00 (HH) <0.03 ng/mL    Comment: CRITICAL VALUE NOTED.  VALUE IS CONSISTENT WITH PREVIOUSLY REPORTED AND CALLED VALUE.  Basic metabolic panel      Status: Abnormal   Collection Time: 01/22/16  7:30 AM  Result Value Ref Range   Sodium 137 135 - 145 mmol/L   Potassium 3.2 (L) 3.5 - 5.1 mmol/L   Chloride 101 101 - 111 mmol/L   CO2 12 (L) 22 - 32 mmol/L   Glucose, Bld 708 (HH) 65 - 99 mg/dL    Comment: CRITICAL RESULT CALLED TO, READ BACK BY AND VERIFIED WITH: [facility] MERLINI,RN AT 2671 01/22/16 BY ZBEECH.    BUN 19 6 - 20 mg/dL   Creatinine, Ser 1.79 (H) 0.44 - 1.00 mg/dL   Calcium 7.2 (L) 8.9 - 10.3 mg/dL   GFR calc non Af Amer 27 (L) >60 mL/min   GFR calc Af Amer 31 (L) >60 mL/min    Comment: (NOTE) The eGFR has been calculated using the CKD EPI equation. This calculation has not been validated in all clinical situations. eGFR's persistently <60 mL/min signify possible Chronic Kidney Disease.    Anion gap 24 (H) 5 - 15  Lactic acid, plasma     Status: Abnormal   Collection Time: 01/22/16  7:30 AM  Result Value Ref Range   Lactic Acid, Venous 14.1 (HH) 0.5 - 1.9 mmol/L    Comment: RESULTS CONFIRMED BY MANUAL DILUTION CRITICAL RESULT CALLED TO, READ BACK BY AND VERIFIED WITH: TANYA SHELTON,RN AT 0830 01/22/16 BY ZBEECH.   Glucose, capillary     Status: Abnormal   Collection Time: 01/22/16  7:44 AM  Result Value Ref Range   Glucose-Capillary >600 (HH) 65 - 99 mg/dL   Comment 1 Arterial Specimen   POCT Activated clotting time     Status: None   Collection Time: 01/22/16  8:08 AM  Result Value Ref Range   Activated Clotting Time 274 seconds  Hemoglobin A1c     Status: Abnormal   Collection Time: 01/22/16  8:10 AM  Result Value Ref Range   Hgb A1c MFr Bld 7.2 (H) 4.8 - 5.6 %    Comment: (NOTE)         Pre-diabetes: 5.7 - 6.4         Diabetes: >6.4         Glycemic control for adults with diabetes: <7.0    Mean Plasma Glucose 160 mg/dL    Comment: (NOTE) Performed At: [facility] [place], [place] [id number] Lindon Romp MD [id number]   I-STAT 3, arterial blood gas (G3+)     Status:  Abnormal   Collection Time: 01/22/16  8:27 AM  Result Value Ref Range   pH, Arterial 7.039 (LL) 7.350 - 7.450   pCO2 arterial 39.5 35.0 - 45.0 mmHg   pO2, Arterial 55.0 (L) 80.0 - 100.0 mmHg   Bicarbonate 10.6 (L) 20.0 - 24.0 mEq/L   TCO2 12 0 - 100 mmol/L   O2 Saturation 73.0 %   Acid-base deficit 19.0 (H) 0.0 - 2.0 mmol/L   Patient temperature HIDE    Sample type ARTERIAL   Glucose, capillary     Status: Abnormal   Collection Time: 01/22/16  9:05 AM  Result Value Ref Range   Glucose-Capillary >600 (HH) 65 - 99 mg/dL  POCT Activated clotting time     Status: None   Collection Time: 01/22/16  9:07 AM  Result Value Ref Range   Activated Clotting Time 257 seconds  Carboxyhemoglobin     Status: None   Collection Time: 01/22/16  9:15 AM  Result Value Ref Range   Total hemoglobin 13.4 12.0 - 16.0 g/dL   O2 Saturation 82.8 %   Carboxyhemoglobin 1.4 0.5 - 1.5 %   Methemoglobin 0.7 0.0 - 1.5 %  I-STAT 3, arterial blood gas (G3+)     Status: Abnormal   Collection Time: 01/22/16  9:36 AM  Result Value Ref Range   pH, Arterial 7.219 (L) 7.350 - 7.450   pCO2 arterial 34.4 (L) 35.0 - 45.0 mmHg   pO2, Arterial 60.0 (L) 80.0 - 100.0 mmHg   Bicarbonate 14.9 (L) 20.0 - 24.0 mEq/L   TCO2 16 0 - 100 mmol/L   O2 Saturation 92.0 %   Acid-base deficit 13.0 (H) 0.0 - 2.0 mmol/L   Patient temperature 32.7 C    Collection site ARTERIAL LINE    Drawn by Operator    Sample type ARTERIAL    Comment NOTIFIED PHYSICIAN   Cortisol     Status: None   Collection Time: 01/22/16 10:00 AM  Result Value Ref Range   Cortisol, Plasma 47.0 ug/dL    Comment: (NOTE) AM    6.7 - 22.6 ug/dL PM   <10.0       ug/dL   Glucose, capillary     Status: Abnormal   Collection Time: 01/22/16 10:12 AM  Result Value Ref Range   Glucose-Capillary >600 (HH) 65 - 99 mg/dL   Comment 1 Arterial Specimen   POCT Activated clotting time     Status: None   Collection Time: 01/22/16 10:15 AM  Result Value Ref Range    Activated Clotting Time 235 seconds  Culture, blood (Routine X 2) w Reflex to ID Panel     Status: None (Preliminary result)   Collection Time: 01/22/16 10:18 AM  Result Value Ref Range   Specimen Description BLOOD LEFT HAND    Special Requests IN PEDIATRIC BOTTLE 1CC    Culture NO GROWTH 1 DAY  Report Status PENDING   I-STAT 3, arterial blood gas (G3+)     Status: Abnormal   Collection Time: 01/22/16 10:23 AM  Result Value Ref Range   pH, Arterial 7.242 (L) 7.350 - 7.450   pCO2 arterial 36.0 35.0 - 45.0 mmHg   pO2, Arterial 138.0 (H) 80.0 - 100.0 mmHg   Bicarbonate 16.2 (L) 20.0 - 24.0 mEq/L   TCO2 18 0 - 100 mmol/L   O2 Saturation 99.0 %   Acid-base deficit 11.0 (H) 0.0 - 2.0 mmol/L   Patient temperature 33.4 C    Collection site ARTERIAL LINE    Drawn by Operator    Sample type ARTERIAL    Comment NOTIFIED PHYSICIAN   I-STAT, chem 8     Status: Abnormal   Collection Time: 01/22/16 10:27 AM  Result Value Ref Range   Sodium 142 135 - 145 mmol/L   Potassium 2.7 (LL) 3.5 - 5.1 mmol/L   Chloride 96 (L) 101 - 111 mmol/L   BUN 28 (H) 6 - 20 mg/dL   Creatinine, Ser 1.50 (H) 0.44 - 1.00 mg/dL   Glucose, Bld >700 (HH) 65 - 99 mg/dL   Calcium, Ion 0.87 (L) 1.12 - 1.23 mmol/L   TCO2 18 0 - 100 mmol/L   Hemoglobin 13.3 12.0 - 15.0 g/dL   HCT 39.0 36.0 - 46.0 %   Comment NOTIFIED PHYSICIAN   Culture, blood (Routine X 2) w Reflex to ID Panel     Status: None (Preliminary result)   Collection Time: 01/22/16 10:51 AM  Result Value Ref Range   Specimen Description BLOOD LEFT ARM    Special Requests IN PEDIATRIC BOTTLE 3CC    Culture NO GROWTH 1 DAY    Report Status PENDING   Glucose, capillary     Status: Abnormal   Collection Time: 01/22/16 11:19 AM  Result Value Ref Range   Glucose-Capillary >600 (HH) 65 - 99 mg/dL  Basic metabolic panel     Status: Abnormal   Collection Time: 01/22/16 11:38 AM  Result Value Ref Range   Sodium 142 135 - 145 mmol/L   Potassium 2.5 (LL) 3.5 -  5.1 mmol/L    Comment: SPECIMEN HEMOLYZED. HEMOLYSIS MAY AFFECT INTEGRITY OF RESULTS. CRITICAL RESULT CALLED TO, READ BACK BY AND VERIFIED WITH: SANDRA MELINI,RN AT 0240 01/22/16 BY ZBEECH.    Chloride 99 (L) 101 - 111 mmol/L   CO2 16 (L) 22 - 32 mmol/L   Glucose, Bld 755 (HH) 65 - 99 mg/dL    Comment: REPEAT SAME VIAL OF CTRL SANDRA MELINI,RN AT 1224 01/22/16 BY ZBEECH. CRITICAL RESULT CALLED TO, READ BACK BY AND VERIFIED WITH:    BUN 21 (H) 6 - 20 mg/dL   Creatinine, Ser 2.17 (H) 0.44 - 1.00 mg/dL   Calcium 6.7 (L) 8.9 - 10.3 mg/dL   GFR calc non Af Amer 21 (L) >60 mL/min   GFR calc Af Amer 25 (L) >60 mL/min    Comment: (NOTE) The eGFR has been calculated using the CKD EPI equation. This calculation has not been validated in all clinical situations. eGFR's persistently <60 mL/min signify possible Chronic Kidney Disease.    Anion gap 27 (H) 5 - 15  Hepatic function panel     Status: Abnormal   Collection Time: 01/22/16 11:38 AM  Result Value Ref Range   Total Protein 3.9 (L) 6.5 - 8.1 g/dL   Albumin 1.9 (L) 3.5 - 5.0 g/dL   AST 471 (H) 15 - 41 U/L  ALT 228 (H) 14 - 54 U/L    Comment: RESULTS CONFIRMED BY MANUAL DILUTION   Alkaline Phosphatase 136 (H) 38 - 126 U/L   Total Bilirubin 1.4 (H) 0.3 - 1.2 mg/dL   Bilirubin, Direct 0.8 (H) 0.1 - 0.5 mg/dL   Indirect Bilirubin 0.6 0.3 - 0.9 mg/dL  Protime-INR     Status: Abnormal   Collection Time: 01/22/16 11:38 AM  Result Value Ref Range   Prothrombin Time 29.7 (H) 11.4 - 15.2 seconds    Comment: SPECIMEN HEMOLYZED. HEMOLYSIS MAY AFFECT INTEGRITY OF RESULTS.   INR 2.76     Comment: SPECIMEN HEMOLYZED. HEMOLYSIS MAY AFFECT INTEGRITY OF RESULTS.  APTT     Status: Abnormal   Collection Time: 01/22/16 11:38 AM  Result Value Ref Range   aPTT 126 (H) 24 - 36 seconds    Comment:        IF BASELINE aPTT IS ELEVATED, SUGGEST PATIENT RISK ASSESSMENT BE USED TO DETERMINE APPROPRIATE ANTICOAGULANT THERAPY. SPECIMEN HEMOLYZED. HEMOLYSIS  MAY AFFECT INTEGRITY OF RESULTS.   Lactate dehydrogenase     Status: Abnormal   Collection Time: 01/22/16 11:38 AM  Result Value Ref Range   LDH 1,507 (H) 98 - 192 U/L    Comment: SPECIMEN HEMOLYZED. HEMOLYSIS MAY AFFECT INTEGRITY OF RESULTS.  Glucose, capillary     Status: Abnormal   Collection Time: 01/22/16 12:09 PM  Result Value Ref Range   Glucose-Capillary >600 (HH) 65 - 99 mg/dL   Comment 1 Arterial Specimen   POCT Activated clotting time     Status: None   Collection Time: 01/22/16 12:12 PM  Result Value Ref Range   Activated Clotting Time 169 seconds  Glucose, capillary     Status: Abnormal   Collection Time: 01/22/16  1:06 PM  Result Value Ref Range   Glucose-Capillary >600 (HH) 65 - 99 mg/dL   Comment 1 Arterial Specimen   Troponin I     Status: Abnormal   Collection Time: 01/22/16  1:09 PM  Result Value Ref Range   Troponin I >65.00 (HH) <0.03 ng/mL    Comment: CRITICAL VALUE NOTED.  VALUE IS CONSISTENT WITH PREVIOUSLY REPORTED AND CALLED VALUE.  I-STAT 3, arterial blood gas (G3+)     Status: Abnormal   Collection Time: 01/22/16  1:10 PM  Result Value Ref Range   pH, Arterial 7.206 (L) 7.350 - 7.450   pCO2 arterial 38.9 35.0 - 45.0 mmHg   pO2, Arterial 69.0 (L) 80.0 - 100.0 mmHg   Bicarbonate 15.8 (L) 20.0 - 24.0 mEq/L   TCO2 17 0 - 100 mmol/L   O2 Saturation 92.0 %   Acid-base deficit 12.0 (H) 0.0 - 2.0 mmol/L   Patient temperature 35.2 C    Collection site ARTERIAL LINE    Drawn by Operator    Sample type ARTERIAL    Comment NOTIFIED PHYSICIAN   POCT Activated clotting time     Status: None   Collection Time: 01/22/16  1:14 PM  Result Value Ref Range   Activated Clotting Time 213 seconds  Basic metabolic panel     Status: Abnormal   Collection Time: 01/22/16  2:00 PM  Result Value Ref Range   Sodium 138 135 - 145 mmol/L   Potassium 2.3 (LL) 3.5 - 5.1 mmol/L    Comment: CRITICAL RESULT CALLED TO, READ BACK BY AND VERIFIED WITH: ROCK,B RN @ 6720 01/22/16  LEONARD,A SPECIMEN HEMOLYZED. HEMOLYSIS MAY AFFECT INTEGRITY OF RESULTS.    Chloride 97 (L) 101 -  111 mmol/L   CO2 16 (L) 22 - 32 mmol/L   Glucose, Bld 781 (HH) 65 - 99 mg/dL    Comment: CRITICAL RESULT CALLED TO, READ BACK BY AND VERIFIED WITH: ROCK,B RN @ 1457 01/22/16 LEONARD,A    BUN 23 (H) 6 - 20 mg/dL   Creatinine, Ser 2.56 (H) 0.44 - 1.00 mg/dL   Calcium 6.7 (L) 8.9 - 10.3 mg/dL   GFR calc non Af Amer 20 (L) >60 mL/min   GFR calc Af Amer 23 (L) >60 mL/min    Comment: (NOTE) The eGFR has been calculated using the CKD EPI equation. This calculation has not been validated in all clinical situations. eGFR's persistently <60 mL/min signify possible Chronic Kidney Disease.    Anion gap 25 (H) 5 - 15  Glucose, capillary     Status: Abnormal   Collection Time: 01/22/16  2:00 PM  Result Value Ref Range   Glucose-Capillary >600 (HH) 65 - 99 mg/dL   Comment 1 Arterial Specimen   POCT Activated clotting time     Status: None   Collection Time: 01/22/16  2:01 PM  Result Value Ref Range   Activated Clotting Time 202 seconds  Glucose, capillary     Status: Abnormal   Collection Time: 01/22/16  3:05 PM  Result Value Ref Range   Glucose-Capillary >600 (HH) 65 - 99 mg/dL  POCT Activated clotting time     Status: None   Collection Time: 01/22/16  3:15 PM  Result Value Ref Range   Activated Clotting Time 208 seconds  Glucose, capillary     Status: Abnormal   Collection Time: 01/22/16  4:00 PM  Result Value Ref Range   Glucose-Capillary >600 (HH) 65 - 99 mg/dL  POCT Activated clotting time     Status: None   Collection Time: 01/22/16  4:06 PM  Result Value Ref Range   Activated Clotting Time 180 seconds  Blood gas, arterial     Status: Abnormal   Collection Time: 01/22/16  4:15 PM  Result Value Ref Range   FIO2 100.00    Delivery systems VENTILATOR    Mode PRESSURE REGULATED VOLUME CONTROL    VT 500.0 mL   LHR 35.0 resp/min   Peep/cpap 18.0 cm H20   pH, Arterial 7.190 (LL)  7.350 - 7.450    Comment: CRITICAL RESULT CALLED TO, READ BACK BY AND VERIFIED WITH: SUE PALMER,RRT,RCP AT 1629 BY KATEY FARGIS,RRT,RCP ON 01/22/2016    pCO2 arterial 42.3 35.0 - 45.0 mmHg   pO2, Arterial 89.9 80.0 - 100.0 mmHg   Bicarbonate 15.7 (L) 20.0 - 24.0 mEq/L   TCO2 17.0 0 - 100 mmol/L   Acid-base deficit 11.1 (H) 0.0 - 2.0 mmol/L   O2 Saturation 96.2 %   Patient temperature 97.5    Collection site A-LINE    Drawn by [id number]    Sample type ARTERIAL DRAW   Glucose, capillary     Status: Abnormal   Collection Time: 01/22/16  4:57 PM  Result Value Ref Range   Glucose-Capillary >600 (HH) 65 - 99 mg/dL  POCT Activated clotting time     Status: None   Collection Time: 01/22/16  5:01 PM  Result Value Ref Range   Activated Clotting Time 197 seconds  Glucose, capillary     Status: Abnormal   Collection Time: 01/22/16  6:00 PM  Result Value Ref Range   Glucose-Capillary >600 (HH) 65 - 99 mg/dL  POCT Activated clotting time     Status: None  Collection Time: 01/22/16  6:05 PM  Result Value Ref Range   Activated Clotting Time 186 seconds  Basic metabolic panel     Status: Abnormal   Collection Time: 01/22/16  6:06 PM  Result Value Ref Range   Sodium 139 135 - 145 mmol/L   Potassium 2.0 (LL) 3.5 - 5.1 mmol/L    Comment: CRITICAL RESULT CALLED TO, READ BACK BY AND VERIFIED WITH: S MERLINI,RN 1836 01/22/16 D BRADLEY    Chloride 98 (L) 101 - 111 mmol/L   CO2 19 (L) 22 - 32 mmol/L   Glucose, Bld 740 (HH) 65 - 99 mg/dL    Comment: CRITICAL RESULT CALLED TO, READ BACK BY AND VERIFIED WITH: S MERLINI,RN 1836 01/22/16 D BRADLEY    BUN 22 (H) 6 - 20 mg/dL   Creatinine, Ser 2.35 (H) 0.44 - 1.00 mg/dL   Calcium 6.3 (LL) 8.9 - 10.3 mg/dL    Comment: CRITICAL RESULT CALLED TO, READ BACK BY AND VERIFIED WITH: S MERLINI,RN 1836 01/22/16 D BRADLEY    GFR calc non Af Amer 19 (L) >60 mL/min   GFR calc Af Amer 22 (L) >60 mL/min    Comment: (NOTE) The eGFR has been calculated using the CKD EPI  equation. This calculation has not been validated in all clinical situations. eGFR's persistently <60 mL/min signify possible Chronic Kidney Disease.    Anion gap 22 (H) 5 - 15  Glucose, capillary     Status: Abnormal   Collection Time: 01/22/16  6:56 PM  Result Value Ref Range   Glucose-Capillary >600 (HH) 65 - 99 mg/dL   Comment 1 Arterial Specimen   POCT Activated clotting time     Status: None   Collection Time: 01/22/16  6:59 PM  Result Value Ref Range   Activated Clotting Time 191 seconds  Glucose, capillary     Status: Abnormal   Collection Time: 01/22/16  7:58 PM  Result Value Ref Range   Glucose-Capillary >600 (HH) 65 - 99 mg/dL   Comment 1 Arterial Specimen   POCT Activated clotting time     Status: None   Collection Time: 01/22/16  8:01 PM  Result Value Ref Range   Activated Clotting Time 197 seconds  Blood gas, arterial     Status: Abnormal   Collection Time: 01/22/16  8:27 PM  Result Value Ref Range   FIO2 1.00    Delivery systems VENTILATOR    Mode PRESSURE REGULATED VOLUME CONTROL    VT 500 mL   LHR 35 resp/min   Peep/cpap 18.0 cm H20   pH, Arterial 7.245 (L) 7.350 - 7.450   pCO2 arterial 41.5 35.0 - 45.0 mmHg   pO2, Arterial 94.1 80.0 - 100.0 mmHg   Bicarbonate 17.4 (L) 20.0 - 24.0 mEq/L   TCO2 18.7 0 - 100 mmol/L   Acid-base deficit 8.6 (H) 0.0 - 2.0 mmol/L   O2 Saturation 96.9 %   Patient temperature 98.6    Collection site A-LINE    Drawn by [id number]    Sample type ARTERIAL DRAW    Allens test (pass/fail) PASS PASS  Glucose, capillary     Status: Abnormal   Collection Time: 01/22/16  8:56 PM  Result Value Ref Range   Glucose-Capillary >600 (HH) 65 - 99 mg/dL   Comment 1 Arterial Specimen   POCT Activated clotting time     Status: None   Collection Time: 01/22/16  8:59 PM  Result Value Ref Range   Activated Clotting Time 191 seconds  Basic metabolic panel     Status: Abnormal   Collection Time: 01/22/16 10:00 PM  Result Value Ref Range   Sodium  136 135 - 145 mmol/L   Potassium 2.3 (LL) 3.5 - 5.1 mmol/L    Comment: CRITICAL RESULT CALLED TO, READ BACK BY AND VERIFIED WITH: NORMENT M,RN 01/22/16 2235 WAYK    Chloride 95 (L) 101 - 111 mmol/L   CO2 18 (L) 22 - 32 mmol/L   Glucose, Bld 675 (HH) 65 - 99 mg/dL    Comment: CRITICAL RESULT CALLED TO, READ BACK BY AND VERIFIED WITH: NORMENT M,RN 01/22/16 2235 WAYK    BUN 23 (H) 6 - 20 mg/dL   Creatinine, Ser 2.53 (H) 0.44 - 1.00 mg/dL   Calcium 7.0 (L) 8.9 - 10.3 mg/dL   GFR calc non Af Amer 18 (L) >60 mL/min   GFR calc Af Amer 20 (L) >60 mL/min    Comment: (NOTE) The eGFR has been calculated using the CKD EPI equation. This calculation has not been validated in all clinical situations. eGFR's persistently <60 mL/min signify possible Chronic Kidney Disease.    Anion gap 23 (H) 5 - 15  Glucose, capillary     Status: Abnormal   Collection Time: 01/22/16 10:02 PM  Result Value Ref Range   Glucose-Capillary >600 (HH) 65 - 99 mg/dL   Comment 1 Arterial Specimen   POCT Activated clotting time     Status: None   Collection Time: 01/22/16 10:06 PM  Result Value Ref Range   Activated Clotting Time 197 seconds  Troponin I     Status: Abnormal   Collection Time: 01/22/16 10:13 PM  Result Value Ref Range   Troponin I >65.00 (HH) <0.03 ng/mL    Comment: CRITICAL VALUE NOTED.  VALUE IS CONSISTENT WITH PREVIOUSLY REPORTED AND CALLED VALUE.  Blood gas, arterial     Status: Abnormal   Collection Time: 01/22/16 10:56 PM  Result Value Ref Range   FIO2 1.00    Delivery systems VENTILATOR    Mode PRESSURE REGULATED VOLUME CONTROL    VT 500 mL   LHR 35 resp/min   Peep/cpap 18.0 cm H20   pH, Arterial 7.248 (L) 7.350 - 7.450   pCO2 arterial 45.9 (H) 35.0 - 45.0 mmHg   pO2, Arterial 108 (H) 80.0 - 100.0 mmHg   Bicarbonate 19.4 (L) 20.0 - 24.0 mEq/L   TCO2 20.8 0 - 100 mmol/L   Acid-base deficit 6.6 (H) 0.0 - 2.0 mmol/L   O2 Saturation 97.8 %   Patient temperature 98.6    Collection site  A-LINE    Drawn by [id number]    Sample type ARTERIAL DRAW    Allens test (pass/fail) PASS PASS  Glucose, capillary     Status: Abnormal   Collection Time: 01/22/16 10:59 PM  Result Value Ref Range   Glucose-Capillary 581 (HH) 65 - 99 mg/dL   Comment 1 Arterial Specimen   POCT Activated clotting time     Status: None   Collection Time: 01/22/16 11:01 PM  Result Value Ref Range   Activated Clotting Time 197 seconds  Glucose, capillary     Status: Abnormal   Collection Time: 01/22/16 11:36 PM  Result Value Ref Range   Glucose-Capillary >600 (HH) 65 - 99 mg/dL  Lactate dehydrogenase     Status: Abnormal   Collection Time: 01/23/16 12:00 AM  Result Value Ref Range   LDH 2,551 (H) 98 - 192 U/L    Comment: SLIGHT HEMOLYSIS  Glucose,  capillary     Status: Abnormal   Collection Time: 01/23/16 12:20 AM  Result Value Ref Range   Glucose-Capillary 599 (HH) 65 - 99 mg/dL   Comment 1 Arterial Specimen   POCT Activated clotting time     Status: None   Collection Time: 01/23/16 12:23 AM  Result Value Ref Range   Activated Clotting Time 186 seconds  Glucose, capillary     Status: Abnormal   Collection Time: 01/23/16  1:12 AM  Result Value Ref Range   Glucose-Capillary 556 (HH) 65 - 99 mg/dL   Comment 1 Arterial Specimen   POCT Activated clotting time     Status: None   Collection Time: 01/23/16  1:22 AM  Result Value Ref Range   Activated Clotting Time 186 seconds  Basic metabolic panel     Status: Abnormal   Collection Time: 01/23/16  2:00 AM  Result Value Ref Range   Sodium 136 135 - 145 mmol/L   Potassium 2.7 (LL) 3.5 - 5.1 mmol/L    Comment: SLIGHT HEMOLYSIS CRITICAL RESULT CALLED TO, READ BACK BY AND VERIFIED WITH: NORMENT M,RN 01/23/16 0254 WAYK    Chloride 93 (L) 101 - 111 mmol/L   CO2 22 22 - 32 mmol/L   Glucose, Bld 595 (HH) 65 - 99 mg/dL    Comment: CRITICAL RESULT CALLED TO, READ BACK BY AND VERIFIED WITH: NORMENT M,RN 01/23/16 0254 WAYK    BUN 24 (H) 6 - 20 mg/dL    Creatinine, Ser 2.66 (H) 0.44 - 1.00 mg/dL   Calcium 6.7 (L) 8.9 - 10.3 mg/dL   GFR calc non Af Amer 17 (L) >60 mL/min   GFR calc Af Amer 19 (L) >60 mL/min    Comment: (NOTE) The eGFR has been calculated using the CKD EPI equation. This calculation has not been validated in all clinical situations. eGFR's persistently <60 mL/min signify possible Chronic Kidney Disease.    Anion gap 21 (H) 5 - 15  Blood gas, arterial     Status: Abnormal   Collection Time: 01/23/16  2:00 AM  Result Value Ref Range   FIO2 1.00    Mode PRESSURE REGULATED VOLUME CONTROL    VT 500 mL   LHR 35 resp/min   Peep/cpap 18.0 cm H20   pH, Arterial 7.274 (L) 7.350 - 7.450   pCO2 arterial 45.7 (H) 35.0 - 45.0 mmHg   pO2, Arterial 90.4 80.0 - 100.0 mmHg   Bicarbonate 20.5 20.0 - 24.0 mEq/L   TCO2 21.9 0 - 100 mmol/L   Acid-base deficit 5.2 (H) 0.0 - 2.0 mmol/L   O2 Saturation 96.6 %   Patient temperature 98.6    Collection site A-LINE    Drawn by [phone]    Sample type ARTERIAL DRAW   Lactate dehydrogenase     Status: Abnormal   Collection Time: 01/23/16  2:00 AM  Result Value Ref Range   LDH 2,666 (H) 98 - 192 U/L  Glucose, capillary     Status: Abnormal   Collection Time: 01/23/16  2:11 AM  Result Value Ref Range   Glucose-Capillary 585 (HH) 65 - 99 mg/dL   Comment 1 Arterial Specimen   POCT Activated clotting time     Status: None   Collection Time: 01/23/16  2:13 AM  Result Value Ref Range   Activated Clotting Time 186 seconds  Glucose, capillary     Status: Abnormal   Collection Time: 01/23/16  3:09 AM  Result Value Ref Range   Glucose-Capillary 538 (  HH) 65 - 99 mg/dL  POCT Activated clotting time     Status: None   Collection Time: 01/23/16  3:11 AM  Result Value Ref Range   Activated Clotting Time 186 seconds  Glucose, capillary     Status: Abnormal   Collection Time: 01/23/16  4:11 AM  Result Value Ref Range   Glucose-Capillary 522 (HH) 65 - 99 mg/dL   Comment 1 Arterial Specimen   POCT  Activated clotting time     Status: None   Collection Time: 01/23/16  4:13 AM  Result Value Ref Range   Activated Clotting Time 186 seconds  Glucose, capillary     Status: Abnormal   Collection Time: 01/23/16  5:09 AM  Result Value Ref Range   Glucose-Capillary 519 (HH) 65 - 99 mg/dL   Comment 1 Arterial Specimen   POCT Activated clotting time     Status: None   Collection Time: 01/23/16  5:35 AM  Result Value Ref Range   Activated Clotting Time 197 seconds  Glucose, capillary     Status: Abnormal   Collection Time: 01/23/16  6:05 AM  Result Value Ref Range   Glucose-Capillary 514 (HH) 65 - 99 mg/dL   Comment 1 Arterial Specimen   Comprehensive metabolic panel     Status: Abnormal   Collection Time: 01/23/16  6:15 AM  Result Value Ref Range   Sodium 134 (L) 135 - 145 mmol/L   Potassium 3.3 (L) 3.5 - 5.1 mmol/L   Chloride 91 (L) 101 - 111 mmol/L   CO2 23 22 - 32 mmol/L   Glucose, Bld 517 (HH) 65 - 99 mg/dL    Comment: CRITICAL RESULT CALLED TO, READ BACK BY AND VERIFIED WITH: S.MERLINI,RN 01/23/16 0751 BY BSLADE    BUN 24 (H) 6 - 20 mg/dL   Creatinine, Ser 2.81 (H) 0.44 - 1.00 mg/dL   Calcium 6.5 (L) 8.9 - 10.3 mg/dL   Total Protein 3.7 (L) 6.5 - 8.1 g/dL   Albumin 1.8 (L) 3.5 - 5.0 g/dL   AST 1,099 (H) 15 - 41 U/L   ALT 335 (H) 14 - 54 U/L   Alkaline Phosphatase 84 38 - 126 U/L   Total Bilirubin 1.8 (H) 0.3 - 1.2 mg/dL   GFR calc non Af Amer 16 (L) >60 mL/min   GFR calc Af Amer 18 (L) >60 mL/min    Comment: (NOTE) The eGFR has been calculated using the CKD EPI equation. This calculation has not been validated in all clinical situations. eGFR's persistently <60 mL/min signify possible Chronic Kidney Disease.    Anion gap 20 (H) 5 - 15  CBC with Differential/Platelet     Status: Abnormal   Collection Time: 01/23/16  6:15 AM  Result Value Ref Range   WBC 34.4 (H) 4.0 - 10.5 K/uL   RBC 4.09 3.87 - 5.11 MIL/uL   Hemoglobin 11.6 (L) 12.0 - 15.0 g/dL   HCT 33.8 (L) 36.0 -  46.0 %   MCV 82.6 78.0 - 100.0 fL   MCH 28.4 26.0 - 34.0 pg   MCHC 34.3 30.0 - 36.0 g/dL   RDW 13.4 11.5 - 15.5 %   Platelets 184 150 - 400 K/uL   Neutrophils Relative % 89 %   Lymphocytes Relative 7 %   Monocytes Relative 4 %   Eosinophils Relative 0 %   Basophils Relative 0 %   Neutro Abs 30.6 (H) 1.7 - 7.7 K/uL   Lymphs Abs 2.4 0.7 - 4.0 K/uL  Monocytes Absolute 1.4 (H) 0.1 - 1.0 K/uL   Eosinophils Absolute 0.0 0.0 - 0.7 K/uL   Basophils Absolute 0.0 0.0 - 0.1 K/uL   RBC Morphology POLYCHROMASIA PRESENT     Comment: BASOPHILIC STIPPLING BURR CELLS    WBC Morphology INCREASED BANDS (>20% BANDS)     Comment: MILD LEFT SHIFT (1-5% METAS, OCC MYELO, OCC BANDS) TOXIC GRANULATION VACUOLATED NEUTROPHILS    Smear Review LARGE PLATELETS PRESENT   Protime-INR     Status: Abnormal   Collection Time: 01/23/16  6:15 AM  Result Value Ref Range   Prothrombin Time 28.2 (H) 11.4 - 15.2 seconds   INR 2.58   POCT Activated clotting time     Status: None   Collection Time: 01/23/16  6:26 AM  Result Value Ref Range   Activated Clotting Time 175 seconds  POCT Activated clotting time     Status: None   Collection Time: 01/23/16  7:07 AM  Result Value Ref Range   Activated Clotting Time 186 seconds  Procalcitonin - Baseline     Status: None   Collection Time: 01/23/16  8:14 AM  Result Value Ref Range   Procalcitonin 94.34 ng/mL    Comment:        Interpretation: PCT >= 10 ng/mL: Important systemic inflammatory response, almost exclusively due to severe bacterial sepsis or septic shock. (NOTE)         ICU PCT Algorithm               Non ICU PCT Algorithm    ----------------------------     ------------------------------         PCT < 0.25 ng/mL                 PCT < 0.1 ng/mL     Stopping of antibiotics            Stopping of antibiotics       strongly encouraged.               strongly encouraged.    ----------------------------     ------------------------------       PCT level  decrease by               PCT < 0.25 ng/mL       >= 80% from peak PCT       OR PCT 0.25 - 0.5 ng/mL          Stopping of antibiotics                                             encouraged.     Stopping of antibiotics           encouraged.    ----------------------------     ------------------------------       PCT level decrease by              PCT >= 0.25 ng/mL       < 80% from peak PCT        AND PCT >= 0.5 ng/mL             Continuing antibiotics  encouraged.       Continuing antibiotics            encouraged.    ----------------------------     ------------------------------     PCT level increase compared          PCT > 0.5 ng/mL         with peak PCT AND          PCT >= 0.5 ng/mL             Escalation of antibiotics                                          strongly encouraged.      Escalation of antibiotics        strongly encouraged.   Bilirubin, fractionated(tot/dir/indir)     Status: Abnormal   Collection Time: 01/23/16  8:15 AM  Result Value Ref Range   Total Bilirubin 2.1 (H) 0.3 - 1.2 mg/dL   Bilirubin, Direct 1.4 (H) 0.1 - 0.5 mg/dL   Indirect Bilirubin 0.7 0.3 - 0.9 mg/dL  Reticulocytes     Status: None   Collection Time: 01/23/16  8:15 AM  Result Value Ref Range   Retic Ct Pct 1.6 0.4 - 3.1 %   RBC. 4.00 3.87 - 5.11 MIL/uL   Retic Count, Manual 64.0 19.0 - 186.0 K/uL  CBC     Status: Abnormal   Collection Time: 01/23/16  8:15 AM  Result Value Ref Range   WBC 34.2 (H) 4.0 - 10.5 K/uL    Comment: REPEATED TO VERIFY CONSISTENT WITH PREVIOUS RESULT    RBC 4.00 3.87 - 5.11 MIL/uL   Hemoglobin 11.4 (L) 12.0 - 15.0 g/dL    Comment: REPEATED TO VERIFY CONSISTENT WITH PREVIOUS RESULT    HCT 33.3 (L) 36.0 - 46.0 %   MCV 83.3 78.0 - 100.0 fL   MCH 28.5 26.0 - 34.0 pg   MCHC 34.2 30.0 - 36.0 g/dL   RDW 13.6 11.5 - 15.5 %   Platelets 160 150 - 400 K/uL    Comment: REPEATED TO VERIFY CONSISTENT WITH PREVIOUS RESULT    POCT Activated clotting time     Status: None   Collection Time: 01/23/16  8:19 AM  Result Value Ref Range   Activated Clotting Time 180 seconds  POCT Activated clotting time     Status: None   Collection Time: 01/23/16  9:03 AM  Result Value Ref Range   Activated Clotting Time 186 seconds  I-STAT 3, arterial blood gas (G3+)     Status: Abnormal   Collection Time: 01/23/16  9:09 AM  Result Value Ref Range   pH, Arterial 7.324 (L) 7.350 - 7.450   pCO2 arterial 44.4 35.0 - 45.0 mmHg   pO2, Arterial 68.0 (L) 80.0 - 100.0 mmHg   Bicarbonate 23.2 20.0 - 24.0 mEq/L   TCO2 25 0 - 100 mmol/L   O2 Saturation 92.0 %   Acid-base deficit 3.0 (H) 0.0 - 2.0 mmol/L   Patient temperature 36.7 C    Collection site ARTERIAL LINE    Drawn by Operator    Sample type ARTERIAL   POCT Activated clotting time     Status: None   Collection Time: 01/23/16 10:10 AM  Result Value Ref Range   Activated Clotting Time 186 seconds  POCT Activated clotting time     Status: None  Collection Time: 01/23/16 11:14 AM  Result Value Ref Range   Activated Clotting Time 186 seconds  Basic metabolic panel     Status: Abnormal   Collection Time: 01/23/16 11:41 AM  Result Value Ref Range   Sodium 132 (L) 135 - 145 mmol/L   Potassium 4.1 3.5 - 5.1 mmol/L   Chloride 90 (L) 101 - 111 mmol/L   CO2 24 22 - 32 mmol/L   Glucose, Bld 426 (H) 65 - 99 mg/dL   BUN 24 (H) 6 - 20 mg/dL   Creatinine, Ser 2.94 (H) 0.44 - 1.00 mg/dL   Calcium 6.3 (LL) 8.9 - 10.3 mg/dL    Comment: CRITICAL RESULT CALLED TO, READ BACK BY AND VERIFIED WITH: B.ROCK,RN 01/23/16 1219 BY BSLADE    GFR calc non Af Amer 15 (L) >60 mL/min   GFR calc Af Amer 17 (L) >60 mL/min    Comment: (NOTE) The eGFR has been calculated using the CKD EPI equation. This calculation has not been validated in all clinical situations. eGFR's persistently <60 mL/min signify possible Chronic Kidney Disease.    Anion gap 18 (H) 5 - 15  Vancomycin, random     Status:  None   Collection Time: 01/23/16 11:46 AM  Result Value Ref Range   Vancomycin Rm 15     Comment:        Random Vancomycin therapeutic range is dependent on dosage and time of specimen collection. A peak range is 20.0-40.0 ug/mL A trough range is 5.0-15.0 ug/mL          Culture, respiratory (NON-Expectorated)     Status: None (Preliminary result)   Collection Time: 01/23/16 12:05 PM  Result Value Ref Range   Specimen Description TRACHEAL ASPIRATE    Special Requests NONE    Gram Stain      MODERATE WBC PRESENT,BOTH PMN AND MONONUCLEAR NO ORGANISMS SEEN    Culture PENDING    Report Status PENDING   POCT Activated clotting time     Status: None   Collection Time: 01/23/16 12:09 PM  Result Value Ref Range   Activated Clotting Time 186 seconds  I-STAT 3, arterial blood gas (G3+)     Status: Abnormal   Collection Time: 01/23/16 12:56 PM  Result Value Ref Range   pH, Arterial 7.315 (L) 7.350 - 7.450   pCO2 arterial 46.8 (H) 35.0 - 45.0 mmHg   pO2, Arterial 67.0 (L) 80.0 - 100.0 mmHg   Bicarbonate 23.8 20.0 - 24.0 mEq/L   TCO2 25 0 - 100 mmol/L   O2 Saturation 91.0 %   Acid-base deficit 2.0 0.0 - 2.0 mmol/L   Patient temperature 37.1 C    Collection site ARTERIAL LINE    Drawn by Operator    Sample type ARTERIAL   POCT Activated clotting time     Status: None   Collection Time: 01/23/16  1:24 PM  Result Value Ref Range   Activated Clotting Time 186 seconds  POCT Activated clotting time     Status: None   Collection Time: 01/23/16  2:07 PM  Result Value Ref Range   Activated Clotting Time 191 seconds    Dg Chest Port 1 View  Result Date: 01/23/2016 CLINICAL DATA:  STEMI EXAM: PORTABLE CHEST 1 VIEW COMPARISON:  01/22/2016 FINDINGS: Endotracheal tube remains in good position. NG tube in the stomach. Swan-Ganz catheter from a femoral approach unchanged with the tip in the left lower lobe pulmonary artery. Diffuse bilateral airspace disease shows interval improvement but  remains severe. Probable edema. IMPRESSION: Support lines remain in good position. Diffuse bilateral edema with interval improvement. Electronically Signed   By: Franchot Gallo M.D.   On: 01/23/2016 07:17   Dg Chest Port 1 View  Result Date: 01/22/2016 CLINICAL DATA:  Central line placement, diabetes mellitus, hypertension, GERD EXAM: PORTABLE CHEST 1 VIEW COMPARISON:  Portable exam 0844 hours compared to 0249 hours FINDINGS: Tip of endotracheal tube projects 2.8 cm above carina. Tip of infra diaphragmatic Swan-Ganz catheter projects over LEFT lower lobe pulmonary artery. Nasogastric tube extends into stomach. Impella device projects over ascending aorta. LEFT jugular central venous catheter tip projects over confluence of LEFT brachiocephalic vein and SVC. External pacing leads present. Stable heart size. Severe diffuse BILATERAL airspace infiltrates unchanged. No pneumothorax following jugular line insertion. IMPRESSION: No interval change. Electronically Signed   By: Lavonia Dana M.D.   On: 01/22/2016 09:02   Dg Chest Port 1 View  Result Date: 01/22/2016 CLINICAL DATA:  Acute respiratory failure. EXAM: PORTABLE CHEST 1 VIEW COMPARISON:  Most recent chest imaging 06/27/2011 FINDINGS: Endotracheal tube is 4.2 cm from the carina. Impella device projects over the aorta. Probable Swan-Ganz catheter from an inferior approach tip in the region of the left lower lobe pulmonary artery. Dense right lung consolidation with air bronchograms. Lesser findings on the left and perihilar region. The heart size and mediastinal contours are grossly normal. No evidence of pneumothorax or large pleural effusion. No acute displaced rib fracture. IMPRESSION: 1. Endotracheal tube 4.2 cm from the carina. Impella device in place. Probable left Swan-Ganz catheter from an inferior approach is in the region of the left lower lobe pulmonary artery, retraction of a few cm recommended for optimal placement. 2. Dense right perihilar opacity  with air bronchograms, with similar findings on the left to lesser extent. Considerations include pulmonary edema, aspiration, pneumonia, or ARDS. Pulmonary edema is favored. Electronically Signed   By: Jeb Levering M.D.   On: 01/22/2016 03:12   Dg Abd Portable 1v  Result Date: 01/22/2016 CLINICAL DATA:  Orogastric tube placement.  Initial encounter. EXAM: PORTABLE ABDOMEN - 1 VIEW COMPARISON:  CT of the abdomen and pelvis from 06/11/2006 FINDINGS: An enteric tube is noted ending overlying the body of the stomach. The visualized bowel gas pattern is unremarkable. Scattered air and stool filled loops of colon are seen; no abnormal dilatation of small bowel loops is seen to suggest small bowel obstruction. No free intra-abdominal air is identified, though evaluation for free air is limited on a single supine view. The visualized osseous structures are within normal limits; the sacroiliac joints are unremarkable in appearance. Right basilar airspace opacity raises concern for pneumonia. External pacing pads are noted. IMPRESSION: 1. Enteric tube noted ending overlying the body of the stomach. 2. Unremarkable bowel gas pattern; no free intra-abdominal air seen. 3. Minimal right basilar airspace opacity raises concern for pneumonia. Electronically Signed   By: Garald Balding M.D.   On: 01/22/2016 04:16     Assessment/Plan: 75 year old female with PMH of HTN, DM, HLD, OA, Diverticulitis admitted for acute anterior MI with cardiogenic shock s/p cardiac arrest now with MODS.  1. Acute Renal Failure in setting of Cardiogenic Shock: Patient with MODS requiring pressors. Renal function prior to admission normal with SCr of 0.88. UOP minimal yesterday, none recorded today. No clear absolute indication for CVVHD right now. Will continue to follow and may become candidate if overall picture improves with decreased pressor needs in the next 24-48 hours. Would  keep volume even with CVVHD as she would not tolerate  volume removal. -No clear absolute indication for CVVHD right now -Will continue to follow renal function  2. Acute hypoxic respiratory failure: Intubated, paralyzed and sedated. Possible ARDS. PCCM managing.  3. Cardiogenic shock: Acute STEMI s/p PTCI and Impella 8/7. Requiring pressors. PCCM and Heart Failure managing.  4. Suspected PNA - on vanc/zosyn per primary 5. DM - on insulin drip, per primary 6. Goals of Care - Discussion with family per primary     Zada Finders, MD IMTS PGY2  Renal Attending: Unfortunate critically ill female with cardiogenic shock after an acute AMI. She had a PEA arrest.  EEG reveals abnormal findings. She has a very poor prognosis and is receiving high dose pressor support, Impella support, ventilatory support, experienced severe acidosis and s/p CPR.  I am not optimistic any renal intervention would alter clinical outcome at this time.   We will follow and reevaluate in AM.  Alexavier Tsutsui C

## 2016-01-23 NOTE — Progress Notes (Signed)
CRITICAL VALUE ALERT  Critical value received:  Calcium 6.1  Date of notification:  01/23/2016  Time of notification:  1630  Critical value read back:Yes.    Nurse who received alert:  Merry LoftyBecky Amberlea Spagnuolo, RN  MD notified (1st page):  Tyson AliasFeinstein  Order given for 1g IVPB calcium gluconate. Will continue to monitor patient closely.

## 2016-01-23 NOTE — Progress Notes (Signed)
ANTICOAGULATION CONSULT NOTE - Initial Consult  Pharmacy Consult for heparin Indication: Impella  Allergies  Allergen Reactions  . Baclofen Other (See Comments)    UNKNOWN REACTION, per husband (patient is intubated)  . Ciprofloxacin Other (See Comments)    UNKNOWN REACTION, per husband (patient is intubated)  . Hydrocodone-Acetaminophen Other (See Comments)    Patient states it made her feel like she was having a heart attack  . Other Swelling and Other (See Comments)    Chest tightness-2 unknown antibiotics (One starts with a "B" and the other, a "C")    Patient Measurements: Height:  (165.1 cm) Weight: 223 lb 9.1 oz (101.4 kg) (103.8kg- 2.39kg) IBW/kg (Calculated) : 57 Heparin Dosing Weight: 76kg  Vital Signs: Temp: 98.4 F (36.9 C) (08/08 1400) Temp Source: Core (Comment) (08/08 1400) BP: 86/56 (08/08 1203) Pulse Rate: 106 (08/08 1400)  Labs:  Recent Labs  01/22/16 0429  01/22/16 0730 01/22/16 1027 01/22/16 1138 01/22/16 1309  01/22/16 2213 01/23/16 0200 01/23/16 0615 01/23/16 0815 01/23/16 1141  HGB 13.3  < >  --  13.3  --   --   --   --   --  11.6* 11.4*  --   HCT 41.1  < >  --  39.0  --   --   --   --   --  33.8* 33.3*  --   PLT 339  --   --   --   --   --   --   --   --  184 160  --   APTT 191*  --   --   --  126*  --   --   --   --   --   --   --   LABPROT 45.8*  --   --   --  29.7*  --   --   --   --  28.2*  --   --   INR 4.74*  --   --   --  2.76  --   --   --   --  2.58  --   --   CREATININE 1.76*  < > 1.79* 1.50* 2.17*  --   < >  --  2.66* 2.81*  --  2.94*  TROPONINI >65.00*  --  >65.00*  --   --  >65.00*  --  >65.00*  --   --   --   --   < > = values in this interval not displayed.  Estimated Creatinine Clearance: 19.8 mL/min (by C-G formula based on SCr of 2.94 mg/dL).   Medical History: Past Medical History:  Diagnosis Date  . Diabetes mellitus without complication (HCC)   . Diverticulitis   . Edema   . GERD (gastroesophageal reflux  disease)   . Hyperlipidemia   . Hypertension   . Osteoarthritis of both knees    Assessment: 75 y.o. female admitted on 09-Feb-2016 with cardiogenic shock s/p arrest in cath lab. Patient now on impella. ACTs have been 170-190s on purge solution at 14.80mL/hr = Heparin 720 units/hr. No need currently for any peripheral heparin infusions.   Hematoma noted, no overt bleeding noted. Hgb stable in 11s. Pltc trending down to 160 as somewhat expected on impella pump.   Goal of Therapy:  Heparin level 0.3-0.7 units/ml ACT goal 160-180 Monitor platelets by anticoagulation protocol: Yes   Plan:  Continue to follow ACT algorithm  Add systemic heparin as needed  Sheppard Coil PharmD., BCPS Clinical  Pharmacist Pager [phone] 01/23/2016 3:07 PM

## 2016-01-23 NOTE — Progress Notes (Signed)
   Patient with progressive hypotension and hypoxemia despite full support. I spoke with her daughter and husband by telephone and we discussed that we have nothing left to offer her.   We will make her DNR. With no escalation of care. Discussed with bedside nurse.   Damiano Stamper,MD 10:15 PM

## 2016-01-23 NOTE — Progress Notes (Signed)
Pharmacy Antibiotic Note  Tracy MorrowShirley A Mckee is a 75 y.o. female admitted on 02/03/2016 with cardiogenic shock s/p arrest in cath lab. Pharmacy has been consulted for Vancomycin and Zosyn dosing for suspected pneumonia. CXR with diffuse bilateral infiltrates - concern for aspiration pneumonia.   Hypothermia protocol stopped, WBC 34(up). Poor renal function with very little urine output noted. Cxs have not grown anything.  Checked a random vancomycin level this am which resulted at 15, continue with current dose.  Plan: Decrease zosyn to 2.25 q8 hours Continue vancomycin 1g q24 hours  Height: 5\' 5"  (165.1 cm) Weight: 223 lb 9.1 oz (101.4 kg) (103.8kg- 2.39kg) IBW/kg (Calculated) : 57  Temp (24hrs), Avg:97.1 F (36.2 C), Min:95.9 F (35.5 C), Max:99.1 F (37.3 C)   Recent Labs Lab 02/13/2016 2232  01/22/16 0428 01/22/16 0429  01/22/16 0730  01/22/16 1806 01/22/16 2200 01/23/16 0200 01/23/16 0615 01/23/16 0815 01/23/16 1141 01/23/16 1146  WBC 12.3*  --   --  27.5*  --   --   --   --   --   --  34.4* 34.2*  --   --   CREATININE 1.28*  < >  --  1.76*  < > 1.79*  < > 2.35* 2.53* 2.66* 2.81*  --  2.94*  --   LATICACIDVEN  --   --  15.4*  --   --  14.1*  --   --   --   --   --   --   --   --   VANCORANDOM  --   --   --   --   --   --   --   --   --   --   --   --   --  15  < > = values in this interval not displayed.  Estimated Creatinine Clearance: 19.8 mL/min (by C-G formula based on SCr of 2.94 mg/dL).    Allergies  Allergen Reactions  . Baclofen Other (See Comments)    UNKNOWN REACTION, per husband (patient is intubated)  . Ciprofloxacin Other (See Comments)    UNKNOWN REACTION, per husband (patient is intubated)  . Hydrocodone-Acetaminophen Other (See Comments)    Patient states it made her feel like she was having a heart attack  . Other Swelling and Other (See Comments)    Chest tightness-2 unknown antibiotics (One starts with a "B" and the other, a "C")     Antimicrobials this admission: 8/7 Vanc >>  8/7 Zosyn >>   Dose adjustments this admission:  8/8 decrease zosyn to 2.25 q8 hours 8/8 Continue vanc 1g q24 hours 8/8 Vancomycin random 15  Microbiology results: 8/7 Blood Cx x2 - drawn  8/7 Resp Cx - drawn  MRSA PCR neg   Thank you for allowing pharmacy to be a part of this patient's care.  Sheppard CoilFrank Wilson PharmD., BCPS Clinical Pharmacist Pager [phone] 01/23/2016 2:48 PM

## 2016-01-23 NOTE — Procedures (Signed)
ELECTROENCEPHALOGRAM REPORT  Date of Study: 01/23/2016  Patient's Name: Tracy Mckee MRN: [id number] Date of Birth: December 02, 1940  Referring Provider: Merrie Roof, MD  Clinical History: 75 year old female has a history of hypertension and diabetes and hyperlipidemia status post cardiac arrest.  Medications: aspirin chewable tablet 81 mg   chlorhexidine gluconate (SAGE KIT) (PERIDEX) 0.12 % solution 15 mL  L1 cisatracurium (NIMBEX) 200 mg in sodium chloride 0.9 % 200 mL (1 mg/mL) infusion  L1 cisatracurium (NIMBEX) bolus via infusion 4.4 mg   EPINEPHrine (ADRENALIN) 4 mg in dextrose 5 % 250 mL (0.016 mg/mL) infusion   fentaNYL (SUBLIMAZE) 2,500 mcg in sodium chloride 0.9 % 250 mL (10 mcg/mL) infusion   fentaNYL (SUBLIMAZE) bolus via infusion 25 mcg   heparin 50,000 Units in dextrose 5 % 1,000 mL (50 Units/mL)   heparin ADULT infusion 100 units/mL (25000 units/267m sodium chloride 0.45%)   insulin regular (NOVOLIN R,HUMULIN R) 250 Units in sodium chloride 0.9 % 250 mL (1 Units/mL) infusion  L2 lidocaine (cardiac) IV infusion 4 mg/mL   midazolam (VERSED) 50 mg in sodium chloride 0.9 % 50 mL (1 mg/mL) infusion   midazolam (VERSED) injection 1-4 mg   norepinephrine (LEVOPHED) 16 mg in dextrose 5 % 250 mL (0.064 mg/mL) infusion   ondansetron (ZOFRAN) injection 4 mg   pantoprazole (PROTONIX) injection 40 mg   piperacillin-tazobactam (ZOSYN) IVPB 2.25 g   potassium chloride 10 mEq in 50 mL *CENTRAL LINE* IVPB   sodium bicarbonate 150 mEq in sterile water 1,000 mL infusion   sodium chloride flush (NS) 0.9 % injection 10-40 mL   sodium chloride flush (NS) 0.9 % injection 10-40 mL   ticagrelor (BRILINTA) tablet 90 mg   vancomycin (VANCOCIN) IVPB 1000 mg/200 mL premix   vasopressin (PITRESSIN) 40 Units in sodium chloride 0.9 % 250 mL (0.16 Units/mL) infusion  Technical Summary: A multichannel digital EEG recording measured by the international 10-20 system with electrodes applied  with paste and impedances below 5000 ohms performed in our laboratory with EKG monitoring in an intubated and sedated patient.  Hyperventilation and photic stimulation were not performed.  The digital EEG was referentially recorded, reformatted, and digitally filtered in a variety of bipolar and referential montages for optimal display.    Description: The patient is intubated and sedated during the recording. There is loss of normal background activity. The records read at a sensitivity of 3 uV/mm shows diffuse suppression of background activity. There is no spontaneous reactivity or reactivity noted with noxious stimulation. There is muscle artifact over the frontal leads. Hyperventilation and photic stimulation were not performed. There were no epileptiform discharges or electrographic seizures seen.   EKG lead was unremarkable.  Impression: This EEG is markedly abnormal due to diffuse background suppression and lack of EEG reactivity with noxious stimulation.  Clinical Correlation of the above findings indicates severe diffuse cerebral dysfunction that is non-specific in etiology and can be seen in the setting of anoxic/ischemic injury, toxic/metabolic encephalopathies, or medication effect. In the absence of CNS active, sedating, or anesthetic medications, this suggests a poor prognosis. Clinical correlation is advised.  AMetta Clines DO

## 2016-01-23 NOTE — Progress Notes (Signed)
Inpatient Diabetes Program Recommendations  AACE/ADA: New Consensus Statement on Inpatient Glycemic Control (2015)  Target Ranges:  Prepandial:   less than 140 mg/dL      Peak postprandial:   less than 180 mg/dL (1-2 hours)      Critically ill patients:  140 - 180 mg/dL    Inpatient Diabetes Program Recommendations:  Spoke with nurses Sandra Merlini and [facility] Health LLCBecky Rock regaExie Parodyrding glycemic control and Architectglucostabilizer management. Glucostabilizer has been turned off and restarted x 3 to assist with glycemic control per recommendations. Patient's glucose starting to decrease. Will follow and available as needed.  Thank you, Billy FischerJudy E. Isaish Alemu, RN, MSN, CDE Inpatient Glycemic Control Team Team Pager [phone] (8am-5pm) 01/23/2016 10:33 AM

## 2016-01-24 LAB — GLUCOSE, CAPILLARY
GLUCOSE-CAPILLARY: 250 mg/dL — AB (ref 65–99)
GLUCOSE-CAPILLARY: 229 mg/dL — AB (ref 65–99)
GLUCOSE-CAPILLARY: 190 mg/dL — AB (ref 65–99)
Glucose-Capillary: 221 mg/dL — ABNORMAL HIGH (ref 65–99)
Glucose-Capillary: 249 mg/dL — ABNORMAL HIGH (ref 65–99)
Glucose-Capillary: 269 mg/dL — ABNORMAL HIGH (ref 65–99)
Glucose-Capillary: 280 mg/dL — ABNORMAL HIGH (ref 65–99)
Glucose-Capillary: 156 mg/dL — ABNORMAL HIGH (ref 65–99)
Glucose-Capillary: 182 mg/dL — ABNORMAL HIGH (ref 65–99)

## 2016-01-24 LAB — BASIC METABOLIC PANEL
ANION GAP: 18 — AB (ref 5–15)
BUN: 26 mg/dL — ABNORMAL HIGH (ref 6–20)
CHLORIDE: 85 mmol/L — AB (ref 101–111)
CO2: 26 mmol/L (ref 22–32)
CREATININE: 3.33 mg/dL — AB (ref 0.44–1.00)
Calcium: 6.1 mg/dL — CL (ref 8.9–10.3)
GFR calc non Af Amer: 13 mL/min — ABNORMAL LOW (ref >60)
GFR, EST AFRICAN AMERICAN: 15 mL/min — AB (ref >60)
Glucose, Bld: 178 mg/dL — ABNORMAL HIGH (ref 65–99)
Potassium: 4.1 mmol/L (ref 3.5–5.1)
Sodium: 129 mmol/L — ABNORMAL LOW (ref 135–145)

## 2016-01-24 LAB — LIDOCAINE LEVEL: Lidocaine Lvl: 4 ug/mL (ref 1.5–5.0)

## 2016-01-24 LAB — POCT ACTIVATED CLOTTING TIME
ACTIVATED CLOTTING TIME: 197 s
ACTIVATED CLOTTING TIME: 202 s
Activated Clotting Time: 197 seconds
Activated Clotting Time: 191 seconds
Activated Clotting Time: 191 seconds
Activated Clotting Time: 279 seconds

## 2016-01-24 LAB — BLOOD GAS, ARTERIAL
ACID-BASE EXCESS: 0.5 mmol/L (ref 0.0–2.0)
BICARBONATE: 25.6 meq/L — AB (ref 20.0–24.0)
Drawn by: 46203
FIO2: 1
MECHVT: 500 mL
O2 Saturation: 90.4 %
PATIENT TEMPERATURE: 98.6
PCO2 ART: 48.8 mmHg — AB (ref 35.0–45.0)
PEEP: 18 cmH2O
PH ART: 7.34 — AB (ref 7.350–7.450)
PO2 ART: 59.3 mmHg — AB (ref 80.0–100.0)
RATE: 35 resp/min
TCO2: 27.1 mmol/L (ref 0–100)

## 2016-01-24 LAB — HAPTOGLOBIN: Haptoglobin: <10 mg/dL — ABNORMAL LOW (ref 34–200)

## 2016-01-24 MED ORDER — SODIUM BICARBONATE 8.4 % IV SOLN: 50.0000 meq | Freq: Once | INTRAVENOUS | Status: DC

## 2016-01-24 MED ORDER — SODIUM BICARBONATE 8.4 % IV SOLN
50.0000 meq | Freq: Once | INTRAVENOUS | Status: DC
Start: 2016-01-24 — End: 2016-01-24

## 2016-01-25 LAB — CULTURE, RESPIRATORY W GRAM STAIN: Culture: NORMAL

## 2016-01-25 LAB — CULTURE, RESPIRATORY

## 2016-01-26 MED FILL — Medication: Qty: 1 | Status: AC

## 2016-01-27 LAB — CULTURE, BLOOD (ROUTINE X 2)
CULTURE: NO GROWTH
CULTURE: NO GROWTH

## 2016-02-05 ENCOUNTER — Encounter (HOSPITAL_COMMUNITY): Payer: Self-pay | Admitting: *Deleted

## 2016-02-05 NOTE — Progress Notes (Signed)
Pt's death certificate completed and signed by Dr Gala RomneyBensimhon and mailed to health dept.

## 2016-02-06 ENCOUNTER — Ambulatory Visit: Payer: PPO | Admitting: Orthopedic Surgery

## 2016-02-16 NOTE — Procedures (Signed)
Extubation Procedure Note  Patient Details:   Name: Tracy Mckee DOB: [birth year] MRN: [id number]   Airway Documentation:     Evaluation  O2 sats: stable throughout Complications: No apparent complications Patient did tolerate procedure well. Bilateral Breath Sounds: Diminished   No   Pt terminally weaned.  Ouida Sillsyan A Jazzlynn Rawe 02/01/2016, 5:16 AM

## 2016-02-16 NOTE — Progress Notes (Signed)
Pt's  O2 stat 85 and BP dropping 60/40. CCMD and Cardiology notified. Per CCMD 2 amps of bicarb ordered. Please see Cardiology note for further information.

## 2016-02-16 NOTE — Discharge Summary (Addendum)
Advanced Heart Failure Team  Discharge Summary   Patient ID: Modena MorrowShirley A Bowley MRN: [id number], DOB/AGE: Mar 15, 1941 75 y.o. Admit date: 02/05/2016 D/C date:     01/20/2016   Primary Discharge Diagnoses:  1. Cardiogenic Shock - Impella Placed 01/22/2016  2. Acute Respiratory Failure/ARDS - Intubated 8/72017  3. Acute Anterior STEMI- S/P Stent LAD  4. PEA  Arrest 01/22/2016   5. Bilateral Infiltrates/ARDS- Probable Aspiration.PNA  6. AKI  7. Hypokalemia/Hypocalcemia 8. Ventricular tachycardia 9. 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 Abbe AmsterdamHopkins was a 75 year old female has a history of hypertension and diabetes and hyperlipidemia. She had arthroscopic knee surgery on July 26. She developed significant substernal chest pain around 9 PM on 8/6 and presented to [facility] emergency room where she was found to have anterior ST elevation. She had some ventricular tachycardia in the emergency room was started on intravenous amiodarone. She has continued to have chest discomfort and was brought by EMS directly to the cath lab approx 11:45pm.  In the cath lab she was in shock while arterial access was being obtained. She experienced progressive hypoxemia and respiratory distress. She was intubated on the cath table and subsequently experienced PEA arrest and underwent 5 minutes CPR. She required intermitten pushes of epi during the case and  was started on norepi gtt. Cath showed ostial circumflex disease and an acutely occluded LAD. Emergent PCI of the LAD was performed and impella CP device was placed due to ongoing shock and severe acidemia. ABG reported to have pH 6.8   She was transferred to the CCU and the CCM and AHF teams were consulted. She initially was placed on the hypothermia protocol but due to severe hypotension despite max support with high-dose norepinephrine, epinephrine, vasopressin and bicarb drips she was changed to normothermia. Initially she had some mild improvement with  full support. However her course was soon complicated by worsening respiratory failure, AKI and progressive acidemia. Echocardiogram showed EF 20%. She was treated aggressively and broad spectrum abx were added for possible aspiration PNA.Marland Kitchen. However she continued to deteriorate with progressive shock with ventricular tachycardia and worsening hypotension.   The case was discussed with her family and she was made DNR. She passed soon thereafter.    Signed, Arvilla MeresBensimhon, Daniel MD 02/15/2016, 1:06 AM

## 2016-02-16 NOTE — Progress Notes (Addendum)
eLink Physician-Brief Progress Note Patient Name: Tracy Mckee DOB: 1940/07/17 MRN: [id number]   RN calling eMD - states that family ready for terminal wean / Spoke to husband who is at bedside over camera but  Phone. He confirmed desire for terminal wean. Noted patient on nimbex + multiple pressors and bicarb gtt and in criculatory shock and renal failure and fent/versed for sedation with BIS score 30s. fio2 100% Plan cotniue pressors and bicarb gtt Continue vent support Continue sedation at current dose DC nimbex - wait 90 min atleast  - > then send terminal wean orders   - given how sick she is and on fent/versed with bis in 30s death should be imminent, comfortable and she should nto be aware of it and nimbex should not be hastening death or making her aware. Nevertheless, out of abundance of caution (ethics) will dc nimbex for 90min bfore doing terminal wean    Intervention Category Major Interventions: End of life / care limitation discussion  [name] 01/19/2016, 3:45 AM   Nimbex PK Pharmacodynamics/Kinetics  Onset of action: IV: 2-3 minutes  Peak effect: 3-5 minutes  Duration: Dose dependent, 35 to 45 minutes after a single 0.1 mg/kg dose; recovery begins in 20-35 minutes when anesthesia is balanced; recovery is attained in 90% of patients in 25-93 minutes Half-life elimination: 22-29 minutes

## 2016-02-16 NOTE — Progress Notes (Signed)
Pt passed at 0518am with family at the bedside. Devota PaceKaylee Quick RN and Cay SchillingsShelby Alyza Artiaga RN auscultated for 3mins, no heart or lung sounds heard. Before withdrawal of care cardiology gave instructions to turn off Impella. Lisette AbuKaylee Quick notified impella representative Adam C. that device will be discontinued. 150 mL of Fentanyl wasted in sink by Devota PaceKaylee Quick and Valero EnergyShelby Brixton Franko. Patient family was given "Caring Hearts Remember" packet upon leaving.

## 2016-02-16 NOTE — Progress Notes (Signed)
Nimbex turned off at 0335am per CCMD order. BIS monitor reading 34. Will continue to monitor pt.

## 2016-02-16 NOTE — Progress Notes (Signed)
eLink Physician-Brief Progress Note Patient Name: Tracy MorrowShirley A Mckissack DOB: 03-19-1941 MRN: [id number]  Rn calling eMD  Now 90min post dc nimbex BiS 40s  Plan terminwal wean order set sent Patient is to contnue fent/versed gtt through TW  Intervention Category Major Interventions: End of life / care limitation discussion  Cullin Dishman 02/15/2016, 5:12 AM

## 2016-02-16 DEATH — deceased

## 2016-04-23 ENCOUNTER — Encounter (HOSPITAL_COMMUNITY): Payer: Self-pay | Admitting: Interventional Cardiology

## 2017-12-30 IMAGING — CR DG ABD PORTABLE 1V
1 series · 1 of 1 positions shown · non-contrast
Comparison: CT of the abdomen and pelvis from 06/11/2006

CLINICAL DATA: Orogastric tube placement.  Initial encounter.

EXAM:
PORTABLE ABDOMEN - 1 VIEW

[AP]
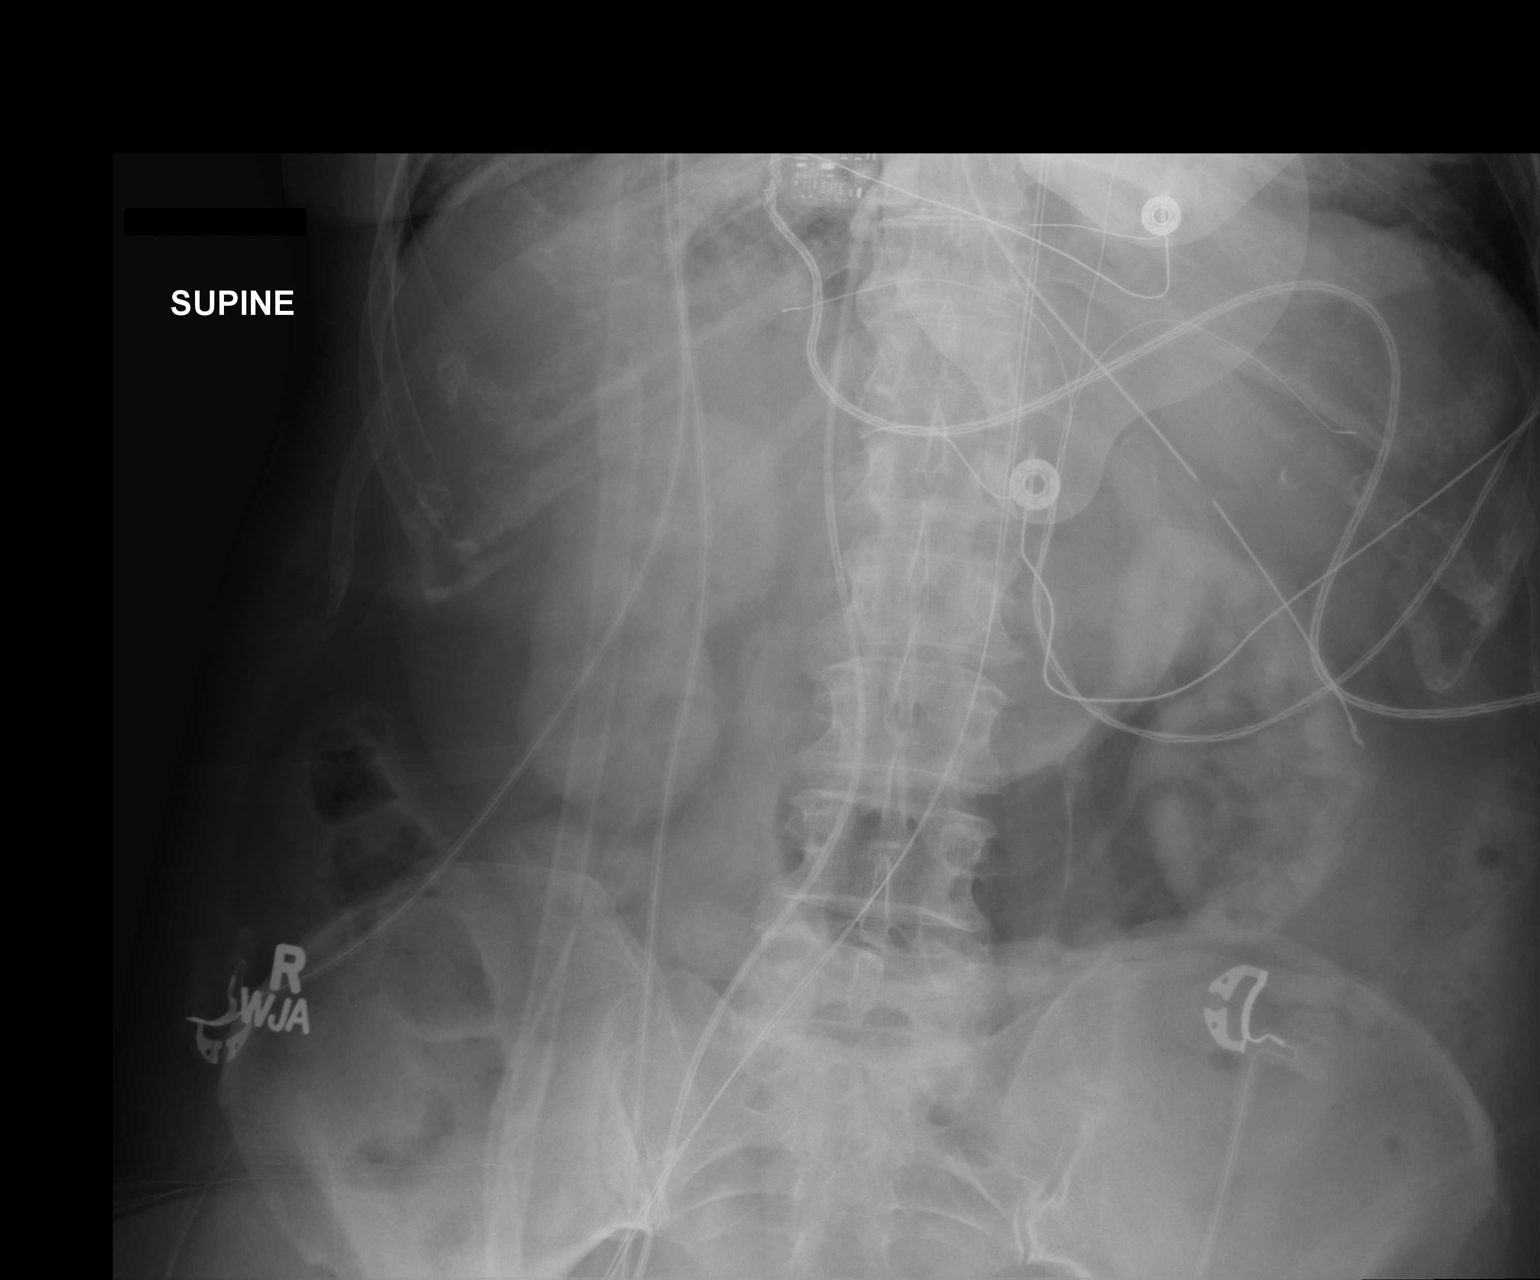

[1 of 1 positions shown; findings below may reference images not displayed]

FINDINGS: An enteric tube is noted ending overlying the body of the stomach.

The visualized bowel gas pattern is unremarkable. Scattered air and
stool filled loops of colon are seen; no abnormal dilatation of
small bowel loops is seen to suggest small bowel obstruction. No
free intra-abdominal air is identified, though evaluation for free
air is limited on a single supine view.

The visualized osseous structures are within normal limits; the
sacroiliac joints are unremarkable in appearance. Right basilar
airspace opacity raises concern for pneumonia. External pacing pads
are noted.
IMPRESSION: 1. Enteric tube noted ending overlying the body of the stomach.
2. Unremarkable bowel gas pattern; no free intra-abdominal air seen.
3. Minimal right basilar airspace opacity raises concern for
pneumonia.

## 2017-12-30 IMAGING — CR DG CHEST 1V PORT
1 series · 1 of 1 positions shown · non-contrast
Comparison: Most recent chest imaging 06/27/2011

CLINICAL DATA: Acute respiratory failure.

EXAM:
PORTABLE CHEST 1 VIEW

[AP]
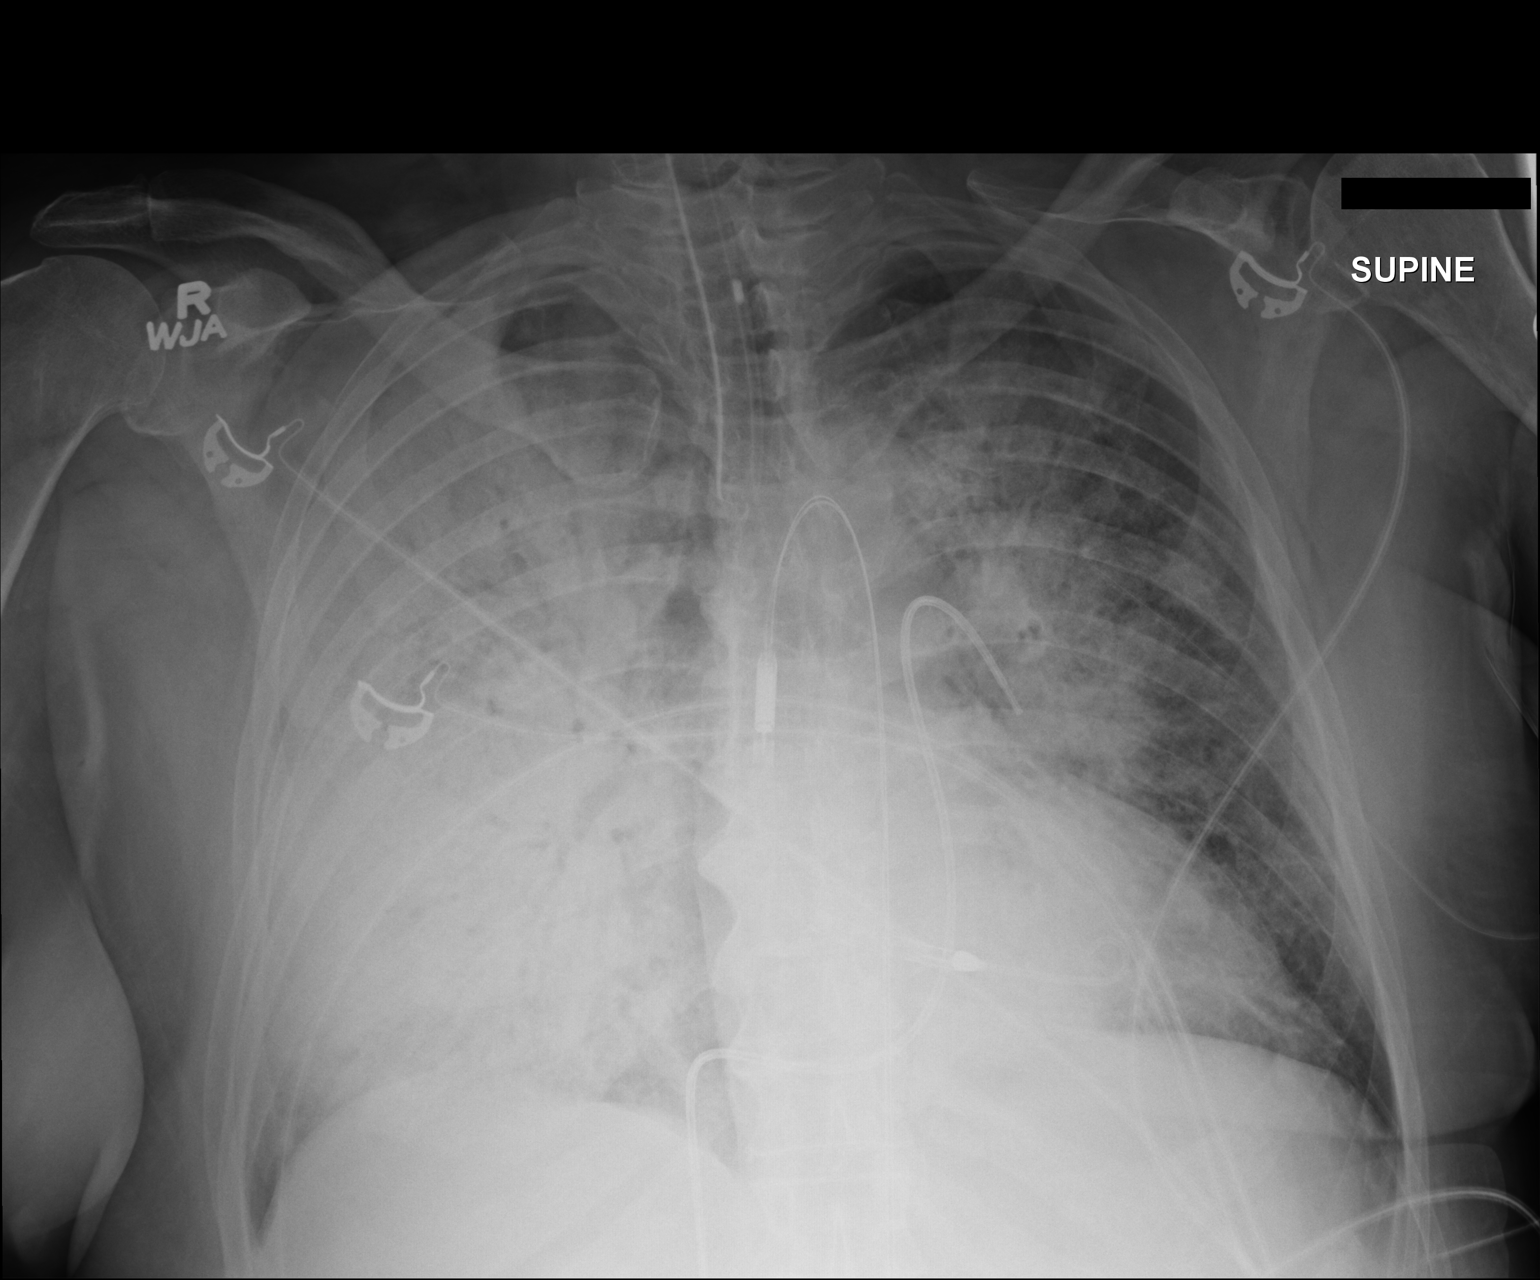

[1 of 1 positions shown; findings below may reference images not displayed]

FINDINGS: Endotracheal tube is 4.2 cm from the carina. Impella device projects
over the aorta. Probable Swan-Ganz catheter from an inferior
approach tip in the region of the left lower lobe pulmonary artery.

Dense right lung consolidation with air bronchograms. Lesser
findings on the left and perihilar region. The heart size and
mediastinal contours are grossly normal. No evidence of pneumothorax
or large pleural effusion. No acute displaced rib fracture.
IMPRESSION: 1. Endotracheal tube 4.2 cm from the carina. Impella device in
place. Probable left Swan-Ganz catheter from an inferior approach is
in the region of the left lower lobe pulmonary artery, retraction of
a few cm recommended for optimal placement.
2. Dense right perihilar opacity with air bronchograms, with similar
findings on the left to lesser extent. Considerations include
pulmonary edema, aspiration, pneumonia, or ARDS. Pulmonary edema is
favored.
